# Patient Record
Sex: Female | Born: 1970
Health system: Southern US, Community
[De-identification: ages and names within clinical notes are randomized; demographics above are authoritative.]

## PROBLEM LIST (undated history)

## (undated) DIAGNOSIS — T8859XA Other complications of anesthesia, initial encounter: Secondary | ICD-10-CM

## (undated) DIAGNOSIS — K589 Irritable bowel syndrome without diarrhea: Secondary | ICD-10-CM

## (undated) DIAGNOSIS — T4145XA Adverse effect of unspecified anesthetic, initial encounter: Secondary | ICD-10-CM

## (undated) DIAGNOSIS — R112 Nausea with vomiting, unspecified: Secondary | ICD-10-CM

## (undated) DIAGNOSIS — L409 Psoriasis, unspecified: Secondary | ICD-10-CM

## (undated) DIAGNOSIS — D259 Leiomyoma of uterus, unspecified: Secondary | ICD-10-CM

## (undated) DIAGNOSIS — Z889 Allergy status to unspecified drugs, medicaments and biological substances status: Secondary | ICD-10-CM

## (undated) DIAGNOSIS — Z9889 Other specified postprocedural states: Secondary | ICD-10-CM

## (undated) DIAGNOSIS — L4050 Arthropathic psoriasis, unspecified: Secondary | ICD-10-CM

## (undated) DIAGNOSIS — K635 Polyp of colon: Secondary | ICD-10-CM

## (undated) DIAGNOSIS — N3010 Interstitial cystitis (chronic) without hematuria: Secondary | ICD-10-CM

## (undated) DIAGNOSIS — Z87410 Personal history of cervical dysplasia: Secondary | ICD-10-CM

## (undated) DIAGNOSIS — K5909 Other constipation: Secondary | ICD-10-CM

## (undated) HISTORY — PX: ESOPHAGOGASTRODUODENOSCOPY: SHX1529

## (undated) HISTORY — PX: FLEXIBLE SIGMOIDOSCOPY: SHX5431

## (undated) HISTORY — PX: OTHER SURGICAL HISTORY: SHX169

## (undated) HISTORY — PX: BLADDER SURGERY: SHX569

## (undated) HISTORY — PX: LIPOSUCTION TRUNK: SUR833

## (undated) HISTORY — PX: CHOLECYSTECTOMY: SHX55

## (undated) HISTORY — PX: COLONOSCOPY: SHX5424

## (undated) HISTORY — PX: TONSILLECTOMY: SUR1361

---
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
## 2000-07-03 ENCOUNTER — Ambulatory Visit (HOSPITAL_COMMUNITY): Admission: RE | Admit: 2000-07-03 | Discharge: 2000-07-03 | Payer: Self-pay | Admitting: *Deleted

## 2000-10-19 ENCOUNTER — Encounter: Payer: Self-pay | Admitting: Obstetrics and Gynecology

## 2000-10-19 ENCOUNTER — Ambulatory Visit (HOSPITAL_COMMUNITY): Admission: RE | Admit: 2000-10-19 | Discharge: 2000-10-19 | Payer: Self-pay | Admitting: Obstetrics and Gynecology

## 2000-11-04 ENCOUNTER — Other Ambulatory Visit: Admission: RE | Admit: 2000-11-04 | Discharge: 2000-11-04 | Payer: Self-pay | Admitting: Obstetrics and Gynecology

## 2001-03-14 ENCOUNTER — Inpatient Hospital Stay (HOSPITAL_COMMUNITY): Admission: AD | Admit: 2001-03-14 | Discharge: 2001-03-14 | Payer: Self-pay | Admitting: Obstetrics and Gynecology

## 2001-03-14 ENCOUNTER — Encounter: Payer: Self-pay | Admitting: Obstetrics and Gynecology

## 2001-07-16 ENCOUNTER — Inpatient Hospital Stay (HOSPITAL_COMMUNITY): Admission: AD | Admit: 2001-07-16 | Discharge: 2001-07-16 | Payer: Self-pay | Admitting: Obstetrics and Gynecology

## 2001-07-17 ENCOUNTER — Inpatient Hospital Stay (HOSPITAL_COMMUNITY): Admission: AD | Admit: 2001-07-17 | Discharge: 2001-07-17 | Payer: Self-pay | Admitting: *Deleted

## 2001-08-18 ENCOUNTER — Inpatient Hospital Stay (HOSPITAL_COMMUNITY): Admission: AD | Admit: 2001-08-18 | Discharge: 2001-08-18 | Payer: Self-pay | Admitting: Obstetrics and Gynecology

## 2001-09-01 ENCOUNTER — Inpatient Hospital Stay (HOSPITAL_COMMUNITY): Admission: AD | Admit: 2001-09-01 | Discharge: 2001-09-01 | Payer: Self-pay | Admitting: Obstetrics and Gynecology

## 2001-09-08 ENCOUNTER — Inpatient Hospital Stay (HOSPITAL_COMMUNITY): Admission: AD | Admit: 2001-09-08 | Discharge: 2001-09-11 | Payer: Self-pay | Admitting: Obstetrics and Gynecology

## 2001-10-18 ENCOUNTER — Other Ambulatory Visit: Admission: RE | Admit: 2001-10-18 | Discharge: 2001-10-18 | Payer: Self-pay | Admitting: Obstetrics and Gynecology

## 2002-05-27 ENCOUNTER — Ambulatory Visit (HOSPITAL_BASED_OUTPATIENT_CLINIC_OR_DEPARTMENT_OTHER): Admission: RE | Admit: 2002-05-27 | Discharge: 2002-05-27 | Payer: Self-pay | Admitting: *Deleted

## 2002-12-23 ENCOUNTER — Other Ambulatory Visit: Admission: RE | Admit: 2002-12-23 | Discharge: 2002-12-23 | Payer: Self-pay | Admitting: Obstetrics and Gynecology

## 2003-05-19 ENCOUNTER — Ambulatory Visit (HOSPITAL_BASED_OUTPATIENT_CLINIC_OR_DEPARTMENT_OTHER): Admission: RE | Admit: 2003-05-19 | Discharge: 2003-05-19 | Payer: Self-pay | Admitting: *Deleted

## 2003-07-31 ENCOUNTER — Other Ambulatory Visit: Admission: RE | Admit: 2003-07-31 | Discharge: 2003-07-31 | Payer: Self-pay | Admitting: Obstetrics and Gynecology

## 2004-01-22 ENCOUNTER — Other Ambulatory Visit: Admission: RE | Admit: 2004-01-22 | Discharge: 2004-01-22 | Payer: Self-pay | Admitting: Obstetrics and Gynecology

## 2004-07-05 ENCOUNTER — Ambulatory Visit (HOSPITAL_COMMUNITY): Admission: RE | Admit: 2004-07-05 | Discharge: 2004-07-05 | Payer: Self-pay | Admitting: *Deleted

## 2004-07-05 ENCOUNTER — Ambulatory Visit (HOSPITAL_BASED_OUTPATIENT_CLINIC_OR_DEPARTMENT_OTHER): Admission: RE | Admit: 2004-07-05 | Discharge: 2004-07-05 | Payer: Self-pay | Admitting: *Deleted

## 2006-01-13 ENCOUNTER — Ambulatory Visit: Payer: Self-pay | Admitting: Gastroenterology

## 2006-01-21 ENCOUNTER — Ambulatory Visit: Payer: Self-pay | Admitting: Otolaryngology

## 2006-07-16 ENCOUNTER — Ambulatory Visit: Payer: Self-pay | Admitting: Urology

## 2006-10-28 ENCOUNTER — Ambulatory Visit: Payer: Self-pay | Admitting: Urology

## 2007-07-15 ENCOUNTER — Ambulatory Visit: Payer: Self-pay | Admitting: General Practice

## 2008-01-05 ENCOUNTER — Ambulatory Visit: Payer: Self-pay | Admitting: General Practice

## 2010-01-29 ENCOUNTER — Ambulatory Visit: Payer: Self-pay | Admitting: General Practice

## 2012-07-06 ENCOUNTER — Other Ambulatory Visit (HOSPITAL_COMMUNITY): Payer: Self-pay | Admitting: Obstetrics and Gynecology

## 2012-07-06 DIAGNOSIS — Z1231 Encounter for screening mammogram for malignant neoplasm of breast: Secondary | ICD-10-CM

## 2012-07-23 ENCOUNTER — Ambulatory Visit (HOSPITAL_COMMUNITY)
Admission: RE | Admit: 2012-07-23 | Discharge: 2012-07-23 | Disposition: A | Discharge status: Home / Self Care | Payer: BC Managed Care – PPO | Source: Ambulatory Visit | Attending: Obstetrics and Gynecology | Admitting: Obstetrics and Gynecology

## 2012-07-23 DIAGNOSIS — Z1231 Encounter for screening mammogram for malignant neoplasm of breast: Secondary | ICD-10-CM | POA: Insufficient documentation

## 2013-05-30 DIAGNOSIS — N393 Stress incontinence (female) (male): Secondary | ICD-10-CM | POA: Insufficient documentation

## 2013-05-30 DIAGNOSIS — D3910 Neoplasm of uncertain behavior of unspecified ovary: Secondary | ICD-10-CM | POA: Insufficient documentation

## 2013-05-30 DIAGNOSIS — R3989 Other symptoms and signs involving the genitourinary system: Secondary | ICD-10-CM | POA: Insufficient documentation

## 2013-05-30 DIAGNOSIS — R350 Frequency of micturition: Secondary | ICD-10-CM | POA: Insufficient documentation

## 2013-05-30 DIAGNOSIS — N858 Other specified noninflammatory disorders of uterus: Secondary | ICD-10-CM | POA: Insufficient documentation

## 2013-05-30 DIAGNOSIS — N9489 Other specified conditions associated with female genital organs and menstrual cycle: Secondary | ICD-10-CM | POA: Insufficient documentation

## 2013-09-21 ENCOUNTER — Other Ambulatory Visit (HOSPITAL_COMMUNITY): Payer: Self-pay | Admitting: Obstetrics and Gynecology

## 2013-09-21 DIAGNOSIS — Z1231 Encounter for screening mammogram for malignant neoplasm of breast: Secondary | ICD-10-CM

## 2013-11-02 ENCOUNTER — Ambulatory Visit (HOSPITAL_COMMUNITY)
Admission: RE | Admit: 2013-11-02 | Discharge: 2013-11-02 | Disposition: A | Discharge status: Home / Self Care | Payer: BC Managed Care – PPO | Source: Ambulatory Visit | Attending: Obstetrics and Gynecology | Admitting: Obstetrics and Gynecology

## 2013-11-02 DIAGNOSIS — Z1231 Encounter for screening mammogram for malignant neoplasm of breast: Secondary | ICD-10-CM | POA: Insufficient documentation

## 2014-01-10 ENCOUNTER — Ambulatory Visit: Payer: Self-pay | Admitting: General Practice

## 2015-01-24 ENCOUNTER — Ambulatory Visit: Payer: Self-pay | Admitting: Surgery

## 2015-01-31 ENCOUNTER — Ambulatory Visit: Payer: Self-pay | Admitting: Surgery

## 2015-02-09 ENCOUNTER — Ambulatory Visit: Payer: Self-pay | Admitting: Surgery

## 2015-04-02 LAB — SURGICAL PATHOLOGY

## 2015-04-08 NOTE — Op Note (Signed)
PATIENT NAME:  Karina [name], Karina [name] MR#:  [id number] DATE OF BIRTH:  02-08-1971  DATE OF PROCEDURE:  02/09/2015  PREOPERATIVE DIAGNOSIS: Chronic cholecystitis.   POSTOPERATIVE DIAGNOSIS: Chronic cholecystitis.   PROCEDURE: Laparoscopic cholecystectomy, cholangiogram.   SURGEON: Rochel Brome, MD  ANESTHESIA: General.   INDICATIONS: This 44 year old female has a history of epigastric discomfort and back discomfort and fatty food intolerance and nausea and history of ultrasound findings of gallstones, recent ultrasound findings of gallbladder polyp, and surgery was recommended for definitive treatment.   DESCRIPTION OF PROCEDURE: The patient was placed on the operating table in the supine position under general endotracheal anesthesia. The abdomen was prepared with ChloraPrep and draped in a sterile manner.   A short incision was made in the inferior aspect of the umbilicus and carried down to the deep fascia which was grasped with a laryngeal hook and elevated. A Veress needle was inserted, aspirated, and irrigated with a saline solution. Next, the peritoneal cavity was inflated with carbon dioxide. The Veress needle was removed. The 10 mm cannula was inserted. The 10 mm, 0 degree laparoscope was inserted to view the peritoneal cavity. The liver appeared smooth. The gallbladder position was noted. There were a few adhesions in the right lower quadrant. The uterus was noted. There appeared to be a trace of menstrual blood.   Next, another incision was made in the epigastrium, slightly to the right of the midline, to introduce an 11 mm cannula. Two incisions were made in the lateral aspect of the right upper quadrant to introduce two 5 mm cannulas.   With the patient in the reverse Trendelenburg position and turned several degrees to the left side, the gallbladder was retracted towards the right shoulder. Multiple adhesions were taken down with blunt and sharp dissection. The infundibulum was retracted  inferiorly and laterally. The location of the porta hepatis was demonstrated. A number of additional adhesions were taken down with additional dissection. The gallbladder neck was mobilized with incision of the visceral peritoneum. The cystic duct was dissected free from surrounding structures. The cystic artery was dissected free from surrounding structures. There was a small lymph node adjacent to the cystic artery. A critical view of safety was demonstrated. An Endo Clip was placed across the cystic duct adjacent to the gallbladder neck. An incision was made in the cystic duct to introduce a Reddick catheter. Half-strength Conray-60 dye was injected as the cholangiogram was done with fluoroscopy viewing the biliary tree and flow of dye into the duodenum. There was a long cystic duct noted. The cholangiogram otherwise appeared normal with no retained stones. The Reddick catheter was removed. The cystic duct was doubly ligated with endoclips and divided. The cystic artery was controlled with endoclips and divided. Another branch of the cystic artery, which may be otherwise a duct of Luschka, was controlled with Endoclip and divided. The gallbladder was dissected free from the liver with hook and cautery. There was very minimal bleeding. It was noted during the near end of the dissection there was some leakage of bile which was aspirated. The gallbladder was subsequently delivered up through the infraumbilical incision and submitted for routine pathology. The operative site was further inspected and was irrigated with heparinized saline solution and aspirated. Hemostasis appeared to be intact. Next, the cannulas were removed, seeing no significant bleeding from the cannula sites. The skin incisions were closed with interrupted 5-0 chromic subcuticular suture, benzoin, and Steri-Strips. Dressings were applied with paper tape. The patient tolerated surgery satisfactorily and was  then prepared for transfer to the  recovery room.  ____________________________ Lenna Sciara. Rochel Brome, MD jws:sb D: 02/09/2015 09:17:25 ET T: 02/09/2015 11:11:51 ET JOB#: [id number]  cc: Loreli Dollar, MD, <Dictator> Loreli Dollar MD ELECTRONICALLY SIGNED 02/15/2015 13:35

## 2015-04-26 DIAGNOSIS — Z87898 Personal history of other specified conditions: Secondary | ICD-10-CM | POA: Insufficient documentation

## 2015-04-26 DIAGNOSIS — K219 Gastro-esophageal reflux disease without esophagitis: Secondary | ICD-10-CM | POA: Insufficient documentation

## 2015-04-26 DIAGNOSIS — L4050 Arthropathic psoriasis, unspecified: Secondary | ICD-10-CM | POA: Insufficient documentation

## 2015-04-26 DIAGNOSIS — Z8639 Personal history of other endocrine, nutritional and metabolic disease: Secondary | ICD-10-CM | POA: Insufficient documentation

## 2015-04-26 DIAGNOSIS — H269 Unspecified cataract: Secondary | ICD-10-CM | POA: Insufficient documentation

## 2015-12-26 DIAGNOSIS — Z79899 Other long term (current) drug therapy: Secondary | ICD-10-CM | POA: Insufficient documentation

## 2015-12-26 DIAGNOSIS — Z840 Family history of diseases of the skin and subcutaneous tissue: Secondary | ICD-10-CM | POA: Insufficient documentation

## 2017-03-21 ENCOUNTER — Encounter: Payer: Self-pay | Admitting: Emergency Medicine

## 2017-03-21 ENCOUNTER — Emergency Department
Admission: EM | Admit: 2017-03-21 | Discharge: 2017-03-21 | Disposition: A | Discharge status: Home / Self Care | Payer: BLUE CROSS/BLUE SHIELD | Attending: Emergency Medicine | Admitting: Emergency Medicine

## 2017-03-21 DIAGNOSIS — Y929 Unspecified place or not applicable: Secondary | ICD-10-CM | POA: Diagnosis not present

## 2017-03-21 DIAGNOSIS — Y939 Activity, unspecified: Secondary | ICD-10-CM | POA: Diagnosis not present

## 2017-03-21 DIAGNOSIS — S0121XA Laceration without foreign body of nose, initial encounter: Secondary | ICD-10-CM | POA: Diagnosis not present

## 2017-03-21 DIAGNOSIS — Y999 Unspecified external cause status: Secondary | ICD-10-CM | POA: Diagnosis not present

## 2017-03-21 DIAGNOSIS — S0185XA Open bite of other part of head, initial encounter: Secondary | ICD-10-CM | POA: Diagnosis present

## 2017-03-21 DIAGNOSIS — S0183XA Puncture wound without foreign body of other part of head, initial encounter: Secondary | ICD-10-CM | POA: Diagnosis not present

## 2017-03-21 DIAGNOSIS — W540XXA Bitten by dog, initial encounter: Secondary | ICD-10-CM | POA: Insufficient documentation

## 2017-03-21 HISTORY — DX: Arthropathic psoriasis, unspecified: L40.50

## 2017-03-21 HISTORY — DX: Interstitial cystitis (chronic) without hematuria: N30.10

## 2017-03-21 HISTORY — DX: Psoriasis, unspecified: L40.9

## 2017-03-21 MED ORDER — AMOXICILLIN-POT CLAVULANATE 875-125 MG PO TABS: 1.0000 | ORAL_TABLET | Freq: Two times a day (BID) | ORAL | 0 refills | Status: DC

## 2017-03-21 MED ORDER — CETIRIZINE HCL 10 MG PO TABS: 10.0000 mg | ORAL_TABLET | Freq: Every day | ORAL | 0 refills | Status: DC

## 2017-03-21 MED ORDER — OXYMETAZOLINE HCL 0.05 % NA SOLN: 2.0000 | Freq: Two times a day (BID) | NASAL | 2 refills | Status: DC | PRN

## 2017-03-21 MED ORDER — FLUTICASONE PROPIONATE 50 MCG/ACT NA SUSP: 1.0000 | Freq: Two times a day (BID) | NASAL | 0 refills | Status: DC

## 2017-03-21 NOTE — ED Triage Notes (Signed)
Pt was meeting friend's dog, sustained bite to face with multiple bite/puncture marks. Friend present with patient, states dog is up to date on rabies shots.

## 2017-03-21 NOTE — ED Notes (Signed)
Pt says her own dog nipped at her and she has a small laceration on her nose;

## 2017-03-21 NOTE — ED Provider Notes (Signed)
[facility] Emergency Department Provider Note  ____________________________________________  Time seen: Approximately 9:05 PM  I have reviewed the triage vital signs and the nursing notes.   HISTORY  Chief Complaint Animal Bite    HPI Karina [name] is a 46 y.o. female who presents emergency department complaining of dog bite to the face. Patient reports that she was meaning her cousin's dog for the first time. She leaned over into his dentist and yard and he nicked her face. The dog did make contact with the distal aspect of the left nares as well as the forehead. Bleeding was easily controlled to the face. She does report some mild oozing of blood from inside the nares. No frank epistaxis. No other injury. Patient's last tetanus shot was 15 years ago but she had a severe allergic reaction to same and has been advised never to receive tetanus injection again. No other medications prior to arrival.   Past Medical History:  Diagnosis Date  . Interstitial cystitis   . Psoriasis   . Psoriatic arthritis ([place])     There are no active problems to display for this patient.   Past Surgical History:  Procedure Laterality Date  . CHOLECYSTECTOMY      Prior to Admission medications   Medication Sig Start Date End Date Taking? Authorizing Provider  amoxicillin-clavulanate (AUGMENTIN) 875-125 MG tablet Take 1 tablet by mouth 2 (two) times daily. 03/21/17   Charline Bills Mathea Frieling, PA-C  cetirizine (ZYRTEC) 10 MG tablet Take 1 tablet (10 mg total) by mouth daily. 03/21/17   Charline Bills Tommie Bohlken, PA-C  fluticasone (FLONASE) 50 MCG/ACT nasal spray Place 1 spray into both nostrils 2 (two) times daily. 03/21/17   Charline Bills Ausha Sieh, PA-C  oxymetazoline (AFRIN) 0.05 % nasal spray Place 2 sprays into both nostrils 2 (two) times daily as needed (epistaxis). 03/21/17   Charline Bills Deantre Bourdon, PA-C    Allergies Macrobid [nitrofurantoin macrocrystal] and Tdap [diphth-acell  pertussis-tetanus]  No family history on file.  Social History Social History  Substance Use Topics  . Smoking status: Never Smoker  . Smokeless tobacco: Never Used  . Alcohol use No     Review of Systems  Constitutional: No fever/chills Eyes: No visual changes. No discharge ENT: Positive for injury to the nose. Positive for mild oozing blood from the left nares. Cardiovascular: no chest pain. Respiratory: no cough. No SOB. Gastrointestinal: No abdominal pain.  No nausea, no vomiting.  Musculoskeletal: Negative for musculoskeletal pain. Skin: Negative for rash, abrasions, lacerations, ecchymosis. Neurological: Negative for headaches, focal weakness or numbness. 10-point ROS otherwise negative.  ____________________________________________   PHYSICAL EXAM:  VITAL SIGNS: ED Triage Vitals  Enc Vitals Group     BP 03/21/17 2058 117/72     Pulse Rate 03/21/17 2058 86     Resp 03/21/17 2058 18     Temp 03/21/17 2058 97.9 F (36.6 C)     Temp Source 03/21/17 2058 Oral     SpO2 03/21/17 2058 99 %     Weight 03/21/17 2058 162 lb (73.5 kg)     Height 03/21/17 2058 5\' 4"  (1.626 m)     Head Circumference --      Peak Flow --      Pain Score 03/21/17 2057 5     Pain Loc --      Pain Edu? --      Excl. in [place]? --      Constitutional: Alert and oriented. Well appearing and in no acute  distress. Eyes: Conjunctivae are normal. PERRL. EOMI. Head: 2 small puncture wounds noted to the forehead, scrape/abrasion to the bridge of the nose, laceration to the left nares. Laceration measures approximately 0.75 cm in length. Edges are well approximated. I just do not move with palpation. No foreign body. Anterior laceration is mildly oozing blood. No frank bleeding. No other cranial injury identified. ENT:      Ears:       Nose: No congestion/rhinnorhea.      Mouth/Throat: Mucous membranes are moist.  Neck: No stridor.  No cervical spine tenderness to palpation.  Cardiovascular: Normal  rate, regular rhythm. Normal S1 and S2.  Good peripheral circulation. Respiratory: Normal respiratory effort without tachypnea or retractions. Lungs CTAB. Good air entry to the bases with no decreased or absent breath sounds. Musculoskeletal: Full range of motion to all extremities. No gross deformities appreciated. Neurologic:  Normal speech and language. No gross focal neurologic deficits are appreciated.  Skin:  Skin is warm, dry and intact. No rash noted. Psychiatric: Mood and affect are normal. Speech and behavior are normal. Patient exhibits appropriate insight and judgement.   ____________________________________________   LABS (all labs ordered are listed, but only abnormal results are displayed)  Labs Reviewed - No data to display ____________________________________________  EKG   ____________________________________________  RADIOLOGY   No results found.  ____________________________________________    PROCEDURES  Procedure(s) performed:    Procedures    Medications - No data to display   ____________________________________________   INITIAL IMPRESSION / ASSESSMENT AND PLAN / ED COURSE  Pertinent labs & imaging results that were available during my care of the patient were reviewed by me and considered in my medical decision making (see chart for details).  Review of the [place] CSRS was performed in accordance of the [place] prior to dispensing any controlled drugs.     Patient's diagnosis is consistent with dog bites to face. Patient has 4 small wounds to the face. Only one laceration with edges well approximated. At this time, with this injury occurring from a dog bite, no closure is attempted. Patient will be given allergy medications to prevent sneezing, Afrin should laceration continued to ooze blood, antibiotics prophylactically..  Patient is to follow up with primary care as needed or otherwise directed. Patient is given ED precautions to return to  the ED for any worsening or new symptoms.     ____________________________________________  FINAL CLINICAL IMPRESSION(S) / ED DIAGNOSES  Final diagnoses:  Dog bite of face, initial encounter      NEW MEDICATIONS STARTED DURING THIS VISIT:  New Prescriptions   AMOXICILLIN-CLAVULANATE (AUGMENTIN) 875-125 MG TABLET    Take 1 tablet by mouth 2 (two) times daily.   CETIRIZINE (ZYRTEC) 10 MG TABLET    Take 1 tablet (10 mg total) by mouth daily.   FLUTICASONE (FLONASE) 50 MCG/ACT NASAL SPRAY    Place 1 spray into both nostrils 2 (two) times daily.   OXYMETAZOLINE (AFRIN) 0.05 % NASAL SPRAY    Place 2 sprays into both nostrils 2 (two) times daily as needed (epistaxis).        This chart was dictated using voice recognition software/Dragon. Despite best efforts to proofread, errors can occur which can change the meaning. Any change was purely unintentional.    Darletta Moll, PA-C 03/21/17 2132    Orbie Pyo, MD 03/22/17 [id number]

## 2017-03-30 DIAGNOSIS — Z6828 Body mass index (BMI) 28.0-28.9, adult: Secondary | ICD-10-CM | POA: Diagnosis not present

## 2017-03-30 DIAGNOSIS — S0992XA Unspecified injury of nose, initial encounter: Secondary | ICD-10-CM | POA: Diagnosis not present

## 2017-06-17 DIAGNOSIS — H43813 Vitreous degeneration, bilateral: Secondary | ICD-10-CM | POA: Diagnosis not present

## 2017-07-01 DIAGNOSIS — G44219 Episodic tension-type headache, not intractable: Secondary | ICD-10-CM | POA: Diagnosis not present

## 2017-07-01 DIAGNOSIS — M530 Cervicocranial syndrome: Secondary | ICD-10-CM | POA: Diagnosis not present

## 2017-07-01 DIAGNOSIS — M5481 Occipital neuralgia: Secondary | ICD-10-CM | POA: Diagnosis not present

## 2017-07-08 DIAGNOSIS — M530 Cervicocranial syndrome: Secondary | ICD-10-CM | POA: Diagnosis not present

## 2017-07-08 DIAGNOSIS — G44219 Episodic tension-type headache, not intractable: Secondary | ICD-10-CM | POA: Diagnosis not present

## 2017-07-08 DIAGNOSIS — M5481 Occipital neuralgia: Secondary | ICD-10-CM | POA: Diagnosis not present

## 2017-07-13 DIAGNOSIS — M9901 Segmental and somatic dysfunction of cervical region: Secondary | ICD-10-CM | POA: Diagnosis not present

## 2017-07-13 DIAGNOSIS — M530 Cervicocranial syndrome: Secondary | ICD-10-CM | POA: Diagnosis not present

## 2017-07-13 DIAGNOSIS — G44219 Episodic tension-type headache, not intractable: Secondary | ICD-10-CM | POA: Diagnosis not present

## 2017-07-13 DIAGNOSIS — M5481 Occipital neuralgia: Secondary | ICD-10-CM | POA: Diagnosis not present

## 2017-07-20 DIAGNOSIS — M530 Cervicocranial syndrome: Secondary | ICD-10-CM | POA: Diagnosis not present

## 2017-07-20 DIAGNOSIS — G44219 Episodic tension-type headache, not intractable: Secondary | ICD-10-CM | POA: Diagnosis not present

## 2017-07-20 DIAGNOSIS — M5481 Occipital neuralgia: Secondary | ICD-10-CM | POA: Diagnosis not present

## 2017-07-20 DIAGNOSIS — M9901 Segmental and somatic dysfunction of cervical region: Secondary | ICD-10-CM | POA: Diagnosis not present

## 2017-07-30 DIAGNOSIS — R87610 Atypical squamous cells of undetermined significance on cytologic smear of cervix (ASC-US): Secondary | ICD-10-CM | POA: Diagnosis not present

## 2017-07-30 DIAGNOSIS — Z1151 Encounter for screening for human papillomavirus (HPV): Secondary | ICD-10-CM | POA: Diagnosis not present

## 2017-07-30 DIAGNOSIS — R109 Unspecified abdominal pain: Secondary | ICD-10-CM | POA: Diagnosis not present

## 2017-07-30 DIAGNOSIS — R5383 Other fatigue: Secondary | ICD-10-CM | POA: Diagnosis not present

## 2017-07-30 DIAGNOSIS — Z1231 Encounter for screening mammogram for malignant neoplasm of breast: Secondary | ICD-10-CM | POA: Diagnosis not present

## 2017-07-30 DIAGNOSIS — Z6827 Body mass index (BMI) 27.0-27.9, adult: Secondary | ICD-10-CM | POA: Diagnosis not present

## 2017-07-30 DIAGNOSIS — Z01419 Encounter for gynecological examination (general) (routine) without abnormal findings: Secondary | ICD-10-CM | POA: Diagnosis not present

## 2017-09-22 DIAGNOSIS — R339 Retention of urine, unspecified: Secondary | ICD-10-CM | POA: Diagnosis not present

## 2017-09-22 DIAGNOSIS — N3010 Interstitial cystitis (chronic) without hematuria: Secondary | ICD-10-CM | POA: Diagnosis not present

## 2017-09-22 DIAGNOSIS — R350 Frequency of micturition: Secondary | ICD-10-CM | POA: Diagnosis not present

## 2017-09-22 DIAGNOSIS — N393 Stress incontinence (female) (male): Secondary | ICD-10-CM | POA: Diagnosis not present

## 2017-09-23 ENCOUNTER — Encounter: Payer: Self-pay | Admitting: Neurology

## 2017-09-23 ENCOUNTER — Ambulatory Visit (INDEPENDENT_AMBULATORY_CARE_PROVIDER_SITE_OTHER): Payer: BLUE CROSS/BLUE SHIELD | Admitting: Neurology

## 2017-09-23 VITALS — BP 108/69 | HR 83 | Ht 64.0 in | Wt 164.4 lb

## 2017-09-23 DIAGNOSIS — H9209 Otalgia, unspecified ear: Secondary | ICD-10-CM | POA: Diagnosis not present

## 2017-09-23 DIAGNOSIS — G8929 Other chronic pain: Secondary | ICD-10-CM

## 2017-09-23 DIAGNOSIS — M4802 Spinal stenosis, cervical region: Secondary | ICD-10-CM

## 2017-09-23 DIAGNOSIS — M4712 Other spondylosis with myelopathy, cervical region: Secondary | ICD-10-CM

## 2017-09-23 DIAGNOSIS — H905 Unspecified sensorineural hearing loss: Secondary | ICD-10-CM | POA: Diagnosis not present

## 2017-09-23 DIAGNOSIS — R51 Headache with orthostatic component, not elsewhere classified: Secondary | ICD-10-CM

## 2017-09-23 DIAGNOSIS — H9190 Unspecified hearing loss, unspecified ear: Secondary | ICD-10-CM

## 2017-09-23 DIAGNOSIS — R519 Headache, unspecified: Secondary | ICD-10-CM | POA: Insufficient documentation

## 2017-09-23 DIAGNOSIS — R42 Dizziness and giddiness: Secondary | ICD-10-CM

## 2017-09-23 DIAGNOSIS — H543 Unqualified visual loss, both eyes: Secondary | ICD-10-CM | POA: Diagnosis not present

## 2017-09-23 DIAGNOSIS — G992 Myelopathy in diseases classified elsewhere: Secondary | ICD-10-CM

## 2017-09-23 MED ORDER — TIZANIDINE HCL 4 MG PO TABS: 4.0000 mg | ORAL_TABLET | Freq: Four times a day (QID) | ORAL | 6 refills | Status: DC | PRN

## 2017-09-23 NOTE — Patient Instructions (Addendum)
MRI of the brain and cervical spine Tizanidine up to 3x a day however if this is migraine may need to have you back for migraine specific medication management and discussion  Occipital Nerve Block Patient Information  Description: The occipital nerves originate in the cervical (neck) spinal cord and travel upward through muscle and tissue to supply sensation to the back of the head and top of the scalp.  In addition, the nerves control some of the muscles of the scalp.  Occipital neuralgia is an irritation of these nerves which can cause headaches, numbness of the scalp, and neck discomfort.     The occipital nerve block will interrupt nerve transmission through these nerves and can relieve pain and spasm.  The block consists of insertion of a small needle under the skin in the back of the head to deposit local anesthetic (numbing medicine) and/or steroids around the nerve.  The entire block usually lasts less than 5 minutes.  Conditions which may be treated by occipital blocks:   Muscular pain and spasm of the scalp  Nerve irritation, back of the head  Headaches  Upper neck pain  Preparation for the injection:  1. Do not eat any solid food or dairy products within 8 hours of your appointment. 2. You may drink clear liquids up to 3 hours before appointment.  Clear liquids include water, black coffee, juice or soda.  No milk or cream please. 3. You may take your regular medication, including pain medications, with a sip of water before you appointment.  Diabetics should hold regular insulin (if taken separately) and take 1/2 normal NPH dose the morning of the procedure.  Carry some sugar containing items with you to your appointment. 4. A driver must accompany you and be prepared to drive you home after your procedure. 5. Bring all your current medications with you. 6. An IV may be inserted and sedation may be given at the discretion of the physician. 7. A blood pressure cuff, EKG, and  other monitors will often be applied during the procedure.  Some patients may need to have extra oxygen administered for a short period. 8. You will be asked to provide medical information, including your allergies and medications, prior to the procedure.  We must know immediately if you are taking blood thinners (like Coumadin/Warfarin) or if you are allergic to IV iodine contrast (dye).  We must know if you could possible be pregnant.  9. Do not wear a high collared shirt or turtleneck.  Tie long hair up in the back if possible.  Possible side-effects:   Bleeding from needle site  Infection (rare, may require surgery)  Nerve injury (rare)  Hair on back of neck can be tinged with iodine scrub (this will wash out)  Light-headedness (temporary)  Pain at injection site (several days)  Decreased blood pressure (rare, temporary)  Seizure (very rare)  Call if you experience:   Hives or difficulty breathing ( go to the emergency room)  Inflammation or drainage at the injection site(s)  Please note:  Although the local anesthetic injected can often make your painful muscles or headache feel good for several hours after the injection, the pain may return.  It takes 3-7 days for steroids to work.  You may not notice any pain relief for at least one week.  If effective, we will often do a series of injections spaced 3-6 weeks apart to maximally decrease your pain.  Tizanidine tablets or capsules What is this medicine? TIZANIDINE (tye  ZAN i deen) helps to relieve muscle spasms. It may be used to help in the treatment of multiple sclerosis and spinal cord injury. This medicine may be used for other purposes; ask your health care provider or pharmacist if you have questions. COMMON BRAND NAME(S): Zanaflex What should I tell my health care provider before I take this medicine? They need to know if you have any of these conditions: -kidney disease -liver disease -low blood  pressure -mental disorder -an unusual or allergic reaction to tizanidine, other medicines, lactose (tablets only), foods, dyes, or preservatives -pregnant or trying to get pregnant -breast-feeding How should I use this medicine? Take this medicine by mouth with a full glass of water. Take this medicine on an empty stomach, at least 30 minutes before or 2 hours after food. Do not take with food unless you talk with your doctor. Follow the directions on the prescription label. Take your medicine at regular intervals. Do not take your medicine more often than directed. Do not stop taking except on your doctor's advice. Suddenly stopping the medicine can be very dangerous. Talk to your pediatrician regarding the use of this medicine in children. Patients over 88 years old may have a stronger reaction and need a smaller dose. Overdosage: If you think you have taken too much of this medicine contact a poison control center or emergency room at once. NOTE: This medicine is only for you. Do not share this medicine with others. What if I miss a dose? If you miss a dose, take it as soon as you can. If it is almost time for your next dose, take only that dose. Do not take double or extra doses. What may interact with this medicine? Do not take this medicine with any of the following medications: -ciprofloxacin -cisapride -dofetilide -dronedarone -fluvoxamine -narcotic medicines for cough -pimozide -thiabendazole -thioridazine -ziprasidone This medicine may also interact with the following medications: -acyclovir -alcohol -antihistamines for allergy, cough and cold -baclofen -certain antibiotics like levofloxacin, ofloxacin -certain medicines for anxiety or sleep -certain medicines for blood pressure, heart disease, irregular heart beat -certain medicines for depression like amitriptyline, fluoxetine, sertraline -certain medicines for seizures like phenobarbital, primidone -certain medicines  for stomach problems like cimetidine, famotidine -female hormones, like estrogens or progestins and birth control pills, patches, rings, or injections -general anesthetics like halothane, isoflurane, methoxyflurane, propofol -local anesthetics like lidocaine, pramoxine, tetracaine -medicines that relax muscles for surgery -narcotic medicines for pain -other medicines that prolong the QT interval (cause an abnormal heart rhythm) -phenothiazines like chlorpromazine, mesoridazine, prochlorperazine -ticlopidine -zileuton This list may not describe all possible interactions. Give your health care provider a list of all the medicines, herbs, non-prescription drugs, or dietary supplements you use. Also tell them if you smoke, drink alcohol, or use illegal drugs. Some items may interact with your medicine. What should I watch for while using this medicine? Tell your doctor or health care professional if your symptoms do not start to get better or if they get worse. You may get drowsy or dizzy. Do not drive, use machinery, or do anything that needs mental alertness until you know how this medicine affects you. Do not stand or sit up quickly, especially if you are an older patient. This reduces the risk of dizzy or fainting spells. Alcohol may interfere with the effect of this medicine. Avoid alcoholic drinks. If you are taking another medicine that also causes drowsiness, you may have more side effects. Give your health care provider a list of all  medicines you use. Your doctor will tell you how much medicine to take. Do not take more medicine than directed. Call emergency for help if you have problems breathing or unusual sleepiness. Your mouth may get dry. Chewing sugarless gum or sucking hard candy, and drinking plenty of water may help. Contact your doctor if the problem does not go away or is severe. What side effects may I notice from receiving this medicine? Side effects that you should report to your  doctor or health care professional as soon as possible: -allergic reactions like skin rash, itching or hives, swelling of the face, lips, or tongue -breathing problems -hallucinations -signs and symptoms of liver injury like dark yellow or brown urine; general ill feeling or flu-like symptoms; light-colored stools; loss of appetite; nausea; right upper quadrant belly pain; unusually weak or tired; yellowing of the eyes or skin -signs and symptoms of low blood pressure like dizziness; feeling faint or lightheaded, falls; unusually weak or tired -unusually slow heartbeat -unusually weak or tired Side effects that usually do not require medical attention (report to your doctor or health care professional if they continue or are bothersome): -blurred vision -constipation -dizziness -dry mouth -tiredness This list may not describe all possible side effects. Call your doctor for medical advice about side effects. You may report side effects to FDA at 1-800-FDA-1088. Where should I keep my medicine? Keep out of the reach of children. Store at room temperature between 15 and 30 degrees C (59 and 86 degrees F). Throw away any unused medicine after the expiration date. NOTE: This sheet is a summary. It may not cover all possible information. If you have questions about this medicine, talk to your doctor, pharmacist, or health care provider.  2018 [employer]vier/Gold Standard (2015-09-04 13:52:12)

## 2017-09-23 NOTE — Addendum Note (Signed)
Addended by: Sarina Ill B on: 09/23/2017 02:40 PM   Modules accepted: Orders

## 2017-09-23 NOTE — Progress Notes (Addendum)
[facility]    Provider:  Dr Jaynee Eagles Referring Provider: Servando Salina, MD Primary Care Physician:  Servando Salina, MD  CC:  Occipital headache  HPI:  Karina [name] is a 46 y.o. female here as a referral from Dr. Garwin Brothers for headache. PMHx cystitis. Starts around the neck (points to about c4) and it curls up over the back of the head. Started 4-5 months ago. It is progressively worsening. No hx of significant headaches or migraines. She has pain in her ears and shooting pains to right above her eyes. She had her eyes checked in July, her vision is changing though she feels. More pulsating in the back. Like someone is squeezing, some light sensitivity, She has dizziness and she will lose vision completely black and then come back. Numbness and tingling in the fingers and toes. She has weakness in the legs, no loss of consciousness. The headaches are daily and continuous, She has 5-6 pain (he has a high pain tolerance she reports). No inciting events or trauma, she did have her tooth extracted but no localized pain there. Worse as the day goes on. Moving exacerbates it. Grandmother with strokes and hemorrhage. Symmetric. She has nausea. No vomiting. She feels hot then cold, she feels like she can;t think straight, no seizure-like episodes. She has tried Restaurant manager, fast food, massage.   Reviewed notes, labs and imaging from outside physicians, which showed:   CT soft tissue of neck:   IMPRESSION:   1.     I do not see evidence of an abnormal mass within the neck. The  submandibular glands appear symmetric in size and enhance in a normal  fashion.  2.     I do not see pathologic sized cervical lymph nodes present.  3.     Should the patient have persistent clinical findings or evidence of  an enlarging mass, follow-up scanning would be of value.   Review of Systems: Patient complains of symptoms per HPI as well as the following symptoms: no CP, no SOB. Pertinent  negatives and positives per HPI. All others negative.   Social History   Social History  . Marital status: Married    Spouse name: N/A  . Number of children: N/A  . Years of education: N/A   Occupational History  . Not on file.   Social History Main Topics  . Smoking status: Never Smoker  . Smokeless tobacco: Never Used  . Alcohol use No  . Drug use: No  . Sexual activity: Not on file   Other Topics Concern  . Not on file   Social History Narrative  . No narrative on file    Family History  Problem Relation Age of Onset  . Migraines Neg Hx     Past Medical History:  Diagnosis Date  . Interstitial cystitis   . Psoriasis   . Psoriatic arthritis [facility])     Past Surgical History:  Procedure Laterality Date  . bladder hydrodistentions    . CHOLECYSTECTOMY      Current Outpatient Prescriptions  Medication Sig Dispense Refill  . Pediatric Multivit-Minerals-C (CHEWABLES MULTIVITAMIN PO) Take by mouth.     No current facility-administered medications for this visit.     Allergies as of 09/23/2017 - Review Complete 09/23/2017  Allergen Reaction Noted  . Macrobid [nitrofurantoin macrocrystal] Nausea And Vomiting and Rash 03/21/2017  . Tdap [diphth-acell pertussis-tetanus] Swelling and Rash 03/21/2017    Vitals: BP 108/69 (BP Location: Right Arm, Patient Position: Sitting, Cuff Size: Small)  Pulse 83   Ht 5\' 4"  (1.626 m)   Wt 164 lb 6.4 oz (74.6 kg)   BMI 28.22 kg/m  Last Weight:  Wt Readings from Last 1 Encounters:  09/23/17 164 lb 6.4 oz (74.6 kg)   Last Height:   Ht Readings from Last 1 Encounters:  09/23/17 5\' 4"  (1.626 m)    Physical exam: Exam: Gen: NAD, conversant, well nourised, well groomed                     CV: RRR, no MRG. No Carotid Bruits. No peripheral edema, warm, nontender Eyes: Conjunctivae clear without exudates or hemorrhage  Neuro: Detailed Neurologic Exam  Speech:    Speech is normal; fluent and spontaneous with normal  comprehension.  Cognition:    The patient is oriented to person, place, and time;     recent and remote memory intact;     language fluent;     normal attention, concentration,     fund of knowledge Cranial Nerves:    The pupils are equal, round, and reactive to light. The fundi are normal and spontaneous venous pulsations are present. Visual fields are full to finger confrontation. Extraocular movements are intact. Trigeminal sensation is intact and the muscles of mastication are normal. The face is symmetric. The palate elevates in the midline. Hearing intact. Voice is normal. Shoulder shrug is normal. The tongue has normal motion without fasciculations.   Coordination:    Normal finger to nose and heel to shin. Normal rapid alternating movements.   Gait:    Heel-toe and tandem gait are normal.   Motor Observation:    No asymmetry, no atrophy, and no involuntary movements noted. Tone:    Normal muscle tone.    Posture:    Posture is normal. normal erect    Strength:    Strength is V/V in the upper and lower limbs.      Sensation: intact to LT     Reflex Exam:  DTR's:    Deep tendon reflexes in the upper and lower extremities are normal bilaterally.   Toes:    The toes are downgoing bilaterally.   Clonus:    Clonus is absent.      Assessment/Plan:  46 year old female with occipital headache and neck pain with movement. Neuro exam in non focal. Pain is bilateral and is located in the distribution of the greater, lesser and/or third occipital nerves and radiates to the frontal scalp area and eyes with episodic complete loss of vision and hearing changes and ear pain, with tenderness and trigger points at the emergence of the greater occipital nerve. Differential includes pain in the upper cervical joints or disks, suboccipital or upper posterior neck muscles including the traps/scm, spinal and posterior cranial fossa dura mater, vertebral arteries, structural and infiltrative  lesions such as meningioma, schwannoma, myelitis, compressive disk disease and others. Will need MRI of the brain and cervical spine. Will restart Tizanidine   - Can consider occipital nerve blocks after imaging results.  -Tizanidine up to 3x a day however if this is migraine may need to have you back for migraine specific medication management and discussion - MRI of the brain with and without contrast due to positional headache and ear pain and hearing changes along with complete vision loss bilaterallyto to evaluate for occupying mass, schwannomas, or other lesions that can cause this. - MRI of the cervical spine to evaluate for cervical radiculopathy and surgical options, C2-C3 nerve root  impingement that can cause occipital headaches and syndromes, transverse myelitis and given her sensory symptoms. - patient is fatigued, CMP, TSH and CBC were ordered will request results.  Discussed: To prevent or relieve headaches, try the following: Cool Compress. Lie down and place a cool compress on your head.  Avoid headache triggers. If certain foods or odors seem to have triggered your migraines in the past, avoid them. A headache diary might help you identify triggers.  Include physical activity in your daily routine. Try a daily walk or other moderate aerobic exercise.  Manage stress. Find healthy ways to cope with the stressors, such as delegating tasks on your to-do list.  Practice relaxation techniques. Try deep breathing, yoga, massage and visualization.  Eat regularly. Eating regularly scheduled meals and maintaining a healthy diet might help prevent headaches. Also, drink plenty of fluids.  Follow a regular sleep schedule. Sleep deprivation might contribute to headaches Consider biofeedback. With this mind-body technique, you learn to control certain bodily functions - such as muscle tension, heart rate and blood pressure - to prevent headaches or reduce headache pain.    Proceed to emergency  room if you experience new or worsening symptoms or symptoms do not resolve, if you have new neurologic symptoms or if headache is severe, or for any concerning symptom.   Provided education and documentation from American headache Society toolbox including articles on: chronic migraine medication overuse headache, chronic migraines, prevention of migraines, behavioral and other nonpharmacologic treatments for headache.  Orders Placed This Encounter  Procedures  . MR BRAIN W WO CONTRAST  . MR CERVICAL SPINE WO CONTRAST    Performed by Dr. Jaynee Eagles M.D. 28ml Lidocaine 1%,30ml Marcaine 0.5% in the 30-gauge needle was used. All procedures a documented blood were medically necessary, reasonable and appropriate based on the patient's history, medical diagnosis and physician opinion. Verbal informed consent was obtained from the patient, patient was informed of potential risk of procedure, including bruising, bleeding, hematoma formation, infection, muscle weakness, muscle pain, numbness, transient hypertension, transient hyperglycemia and transient insomnia among others. All areas injected were topically clean with isopropyl rubbing alcohol. Nonsterile nonlatex gloves were worn during the procedure.  1. Greater occipital nerve block [id number]). The greater occipital nerve site was identified at the nuchal line medial to the occipital artery. Medication was injected into the left and right occipital nerve areas and suboccipital areas. Patient's condition is associated with inflammation of the greater occipital nerve and associated multiple groups. Injection was deemed medically necessary, reasonable and appropriate. Injection represents a separate and unique surgical service.  2. Lesser occipital nerve block [id number]). The lesser occipital nerve site was identified approximately 2 cm lateral to the greater occipital nerve. Occasion was injected into the left and right occipital nerve areas. Patient's condition is  associated with inflammation of the lesser occipital nerve and associated muscle groups. Injection was deemed medically necessary, reasonable and appropriate. Injection represents a separate and unique surgical service.  3. Auriculotemporal nerve block (09811): The Auriculotemporal nerve site was identified along the posterior margin of the sternocleidomastoid muscle toward the base of the ear. Medication was injected into the left and right radicular temporal nerve areas. Patient's condition is associated with inflammation of the Auriculotemporal Nerve and associated muscle groups. Injection was deemed medically necessary, reasonable and appropriate. Injection represents a separate and unique surgical service.   Cc: Servando Salina, MD  Sarina Ill, MD  [facility] [address] [place], [place] [address]  Phone [phone] Fax [phone]

## 2017-10-07 ENCOUNTER — Ambulatory Visit
Admission: RE | Admit: 2017-10-07 | Discharge: 2017-10-07 | Disposition: A | Discharge status: Home / Self Care | Payer: BLUE CROSS/BLUE SHIELD | Source: Ambulatory Visit | Attending: Neurology | Admitting: Neurology

## 2017-10-07 ENCOUNTER — Encounter: Payer: Self-pay | Admitting: Neurology

## 2017-10-07 DIAGNOSIS — R51 Headache with orthostatic component, not elsewhere classified: Secondary | ICD-10-CM

## 2017-10-07 DIAGNOSIS — M4802 Spinal stenosis, cervical region: Secondary | ICD-10-CM

## 2017-10-07 DIAGNOSIS — R519 Headache, unspecified: Secondary | ICD-10-CM

## 2017-10-07 DIAGNOSIS — H9209 Otalgia, unspecified ear: Secondary | ICD-10-CM

## 2017-10-07 DIAGNOSIS — R42 Dizziness and giddiness: Secondary | ICD-10-CM

## 2017-10-07 DIAGNOSIS — G8929 Other chronic pain: Secondary | ICD-10-CM

## 2017-10-07 DIAGNOSIS — M4712 Other spondylosis with myelopathy, cervical region: Secondary | ICD-10-CM | POA: Diagnosis not present

## 2017-10-07 DIAGNOSIS — G992 Myelopathy in diseases classified elsewhere: Secondary | ICD-10-CM

## 2017-10-07 DIAGNOSIS — H543 Unqualified visual loss, both eyes: Secondary | ICD-10-CM

## 2017-10-07 DIAGNOSIS — H9190 Unspecified hearing loss, unspecified ear: Secondary | ICD-10-CM

## 2017-10-07 MED ORDER — GADOBENATE DIMEGLUMINE 529 MG/ML IV SOLN
16.0000 mL | Freq: Once | INTRAVENOUS | Status: AC | PRN
  Administered 2017-10-07: 16 mL via INTRAVENOUS

## 2017-10-19 ENCOUNTER — Telehealth: Payer: Self-pay | Admitting: *Deleted

## 2017-10-19 NOTE — Telephone Encounter (Signed)
Attempted to call patient and LVM asking for call back.  Call is to discuss how she is feeling; may consider an appt for a nerve block with Dr. Jaynee Eagles next week.

## 2017-11-03 DIAGNOSIS — M79671 Pain in right foot: Secondary | ICD-10-CM | POA: Diagnosis not present

## 2017-11-03 DIAGNOSIS — B070 Plantar wart: Secondary | ICD-10-CM | POA: Diagnosis not present

## 2017-11-03 DIAGNOSIS — M79672 Pain in left foot: Secondary | ICD-10-CM | POA: Diagnosis not present

## 2017-11-16 ENCOUNTER — Telehealth: Payer: Self-pay | Admitting: *Deleted

## 2017-11-16 NOTE — Telephone Encounter (Signed)
Called and LVM (ok per DPR) on pt's cell phone informing patient that the office will be closed tomorrow 12/11 due to the snow. We will call back ASAP to reschedule the patient.   Also attempted pt's home # and husband (on [place]) but there was no answer.

## 2017-11-17 ENCOUNTER — Ambulatory Visit: Payer: BLUE CROSS/BLUE SHIELD | Admitting: Neurology

## 2017-11-18 DIAGNOSIS — B070 Plantar wart: Secondary | ICD-10-CM | POA: Diagnosis not present

## 2017-11-18 DIAGNOSIS — M79671 Pain in right foot: Secondary | ICD-10-CM | POA: Diagnosis not present

## 2017-11-18 DIAGNOSIS — M79672 Pain in left foot: Secondary | ICD-10-CM | POA: Diagnosis not present

## 2017-11-19 NOTE — Telephone Encounter (Signed)
Called and spoke with pt. Rescheduled her for Tuesday 11/24/17 @ 4:30 pm with arrival time of 4:00. She verbalized understanding and appreciation.

## 2017-11-24 ENCOUNTER — Ambulatory Visit: Payer: Self-pay | Admitting: Neurology

## 2017-12-08 DIAGNOSIS — J0100 Acute maxillary sinusitis, unspecified: Secondary | ICD-10-CM | POA: Diagnosis not present

## 2017-12-09 DIAGNOSIS — M79671 Pain in right foot: Secondary | ICD-10-CM | POA: Diagnosis not present

## 2017-12-09 DIAGNOSIS — M79672 Pain in left foot: Secondary | ICD-10-CM | POA: Diagnosis not present

## 2017-12-09 DIAGNOSIS — B070 Plantar wart: Secondary | ICD-10-CM | POA: Diagnosis not present

## 2017-12-18 DIAGNOSIS — J069 Acute upper respiratory infection, unspecified: Secondary | ICD-10-CM | POA: Diagnosis not present

## 2017-12-18 DIAGNOSIS — J019 Acute sinusitis, unspecified: Secondary | ICD-10-CM | POA: Diagnosis not present

## 2017-12-28 DIAGNOSIS — B9689 Other specified bacterial agents as the cause of diseases classified elsewhere: Secondary | ICD-10-CM | POA: Diagnosis not present

## 2017-12-28 DIAGNOSIS — J019 Acute sinusitis, unspecified: Secondary | ICD-10-CM | POA: Diagnosis not present

## 2018-03-08 DIAGNOSIS — J320 Chronic maxillary sinusitis: Secondary | ICD-10-CM | POA: Diagnosis not present

## 2018-04-05 DIAGNOSIS — J340 Abscess, furuncle and carbuncle of nose: Secondary | ICD-10-CM | POA: Diagnosis not present

## 2018-04-05 DIAGNOSIS — J301 Allergic rhinitis due to pollen: Secondary | ICD-10-CM | POA: Diagnosis not present

## 2018-04-05 DIAGNOSIS — J320 Chronic maxillary sinusitis: Secondary | ICD-10-CM | POA: Diagnosis not present

## 2018-05-17 ENCOUNTER — Telehealth: Payer: Self-pay | Admitting: Neurology

## 2018-05-17 ENCOUNTER — Encounter: Payer: Self-pay | Admitting: Neurology

## 2018-05-17 NOTE — Telephone Encounter (Signed)
Can see pt on Friday for injection.

## 2018-05-17 NOTE — Telephone Encounter (Signed)
-----   Message from [place], Generic sent at 05/17/2018 12:48 PM EDT -----    Appointment Request From: Karina [name]    With Provider: Melvenia Beam, MD [[facility]]    Preferred Date Range: 05/20/2018 - 05/24/2018    Preferred Times: Monday Afternoon, Thursday Morning, Thursday Afternoon, Friday Afternoon    Reason for visit: Request an Appointment    Comments:  Shots for headaches

## 2018-05-18 NOTE — Telephone Encounter (Signed)
Called pt & LVM (ok per DPR) asking for call back. Informed her that we can do an injection for her this Friday 6/14 at 12:00. When pt calls back, please confirm she is ok with this and we can get her scheduled.

## 2018-05-19 NOTE — Telephone Encounter (Signed)
Called patient once more and LVM asking for call back to get her scheduled for injection. Left office number in message.

## 2018-08-02 ENCOUNTER — Encounter: Payer: Self-pay | Admitting: Urology

## 2018-08-02 ENCOUNTER — Ambulatory Visit: Payer: BLUE CROSS/BLUE SHIELD | Admitting: Urology

## 2018-08-02 VITALS — BP 97/66 | HR 106 | Ht 65.0 in | Wt 174.4 lb

## 2018-08-02 DIAGNOSIS — Z01419 Encounter for gynecological examination (general) (routine) without abnormal findings: Secondary | ICD-10-CM | POA: Diagnosis not present

## 2018-08-02 DIAGNOSIS — R300 Dysuria: Secondary | ICD-10-CM | POA: Diagnosis not present

## 2018-08-02 DIAGNOSIS — R339 Retention of urine, unspecified: Secondary | ICD-10-CM | POA: Diagnosis not present

## 2018-08-02 DIAGNOSIS — N3010 Interstitial cystitis (chronic) without hematuria: Secondary | ICD-10-CM | POA: Diagnosis not present

## 2018-08-02 DIAGNOSIS — Z6828 Body mass index (BMI) 28.0-28.9, adult: Secondary | ICD-10-CM | POA: Diagnosis not present

## 2018-08-02 DIAGNOSIS — Z1231 Encounter for screening mammogram for malignant neoplasm of breast: Secondary | ICD-10-CM | POA: Diagnosis not present

## 2018-08-02 DIAGNOSIS — Z1151 Encounter for screening for human papillomavirus (HPV): Secondary | ICD-10-CM | POA: Diagnosis not present

## 2018-08-02 DIAGNOSIS — R87610 Atypical squamous cells of undetermined significance on cytologic smear of cervix (ASC-US): Secondary | ICD-10-CM | POA: Diagnosis not present

## 2018-08-02 LAB — URINALYSIS, COMPLETE
BILIRUBIN UA: NEGATIVE
GLUCOSE, UA: NEGATIVE
KETONES UA: NEGATIVE
Leukocytes, UA: NEGATIVE
Nitrite, UA: NEGATIVE
PH UA: 5.5 (ref 5.0–7.5)
PROTEIN UA: NEGATIVE
RBC UA: NEGATIVE
SPEC GRAV UA: 1.02 (ref 1.005–1.030)
UUROB: 0.2 mg/dL (ref 0.2–1.0)

## 2018-08-02 LAB — BLADDER SCAN AMB NON-IMAGING

## 2018-08-02 LAB — MICROSCOPIC EXAMINATION
RBC, UA: NONE SEEN /hpf (ref 0–2)
WBC, UA: NONE SEEN /hpf (ref 0–5)

## 2018-08-02 NOTE — Progress Notes (Signed)
08/02/2018 3:34 PM   Karina [name] 03/03/1971 [id number]  Referring provider: No referring provider defined for this encounter.  Chief Complaint  Patient presents with  . Establish Care    HPI: 47 year old female presents to establish local urologic care.  She has previously seen Dr. Jacqlyn Larsen for interstitial cystitis, stress incontinence and a history of recurrent urinary tract infections.  She last saw him in October 2018.  She was on demand therapy for recurrent UTIs with cephalexin and would also take Diflucan for vaginal yeast infections.  She had been on oxybutynin for storage related voiding symptoms however is no longer taking this medication.  She took Uribel for bladder spasms however it became cost prohibitive as it was not covered by her insurance.  Since her last visit she has noted some increased stress urinary incontinence.  She has baseline urinary frequency, urgency and a burning spasmodic pain which has been helped previously by Uribel.  Denies gross hematuria.   PMH: Past Medical History:  Diagnosis Date  . Interstitial cystitis   . Psoriasis   . Psoriatic arthritis [facility])     Surgical History: Past Surgical History:  Procedure Laterality Date  . bladder hydrodistentions    . CHOLECYSTECTOMY      Home Medications:  Allergies as of 08/02/2018      Reactions   Macrobid [nitrofurantoin Macrocrystal] Nausea And Vomiting, Rash   Tdap [tetanus-diphth-acell Pertussis] Swelling, Rash      Medication List        Accurate as of 08/02/18  3:34 PM. Always use your most recent med list.          cephALEXin 500 MG capsule Commonly known as:  KEFLEX cephalexin 500 mg capsule   CHEWABLES MULTIVITAMIN PO Take by mouth.   fluconazole 100 MG tablet Commonly known as:  DIFLUCAN fluconazole 100 mg tablet       Allergies:  Allergies  Allergen Reactions  . Macrobid [Nitrofurantoin Macrocrystal] Nausea And Vomiting and Rash  . Tdap  [Tetanus-Diphth-Acell Pertussis] Swelling and Rash    Family History: Family History  Problem Relation Age of Onset  . Migraines Neg Hx     Social History:  reports that she has never smoked. She has never used smokeless tobacco. She reports that she does not drink alcohol or use drugs.  ROS: UROLOGY Frequent Urination?: Yes Hard to postpone urination?: Yes Burning/pain with urination?: Yes Get up at night to urinate?: Yes Leakage of urine?: Yes Urine stream starts and stops?: No Trouble starting stream?: No Do you have to strain to urinate?: Yes Blood in urine?: No Urinary tract infection?: Yes Sexually transmitted disease?: No Injury to kidneys or bladder?: No Painful intercourse?: Yes Weak stream?: No Currently pregnant?: No Vaginal bleeding?: No Last menstrual period?: Unknown  Gastrointestinal Nausea?: No Vomiting?: No Indigestion/heartburn?: No Diarrhea?: No Constipation?: Yes  Constitutional Fever: No Night sweats?: Yes Weight loss?: No Fatigue?: Yes  Skin Skin rash/lesions?: Yes Itching?: Yes  Eyes Blurred vision?: Yes Double vision?: No  Ears/Nose/Throat Sore throat?: No Sinus problems?: Yes  Hematologic/Lymphatic Swollen glands?: No Easy bruising?: Yes  Cardiovascular Leg swelling?: No Chest pain?: No  Respiratory Cough?: No Shortness of breath?: No  Endocrine Excessive thirst?: No  Musculoskeletal Back pain?: No Joint pain?: No  Neurological Headaches?: Yes Dizziness?: No  Psychologic Depression?: No Anxiety?: No  Physical Exam: BP 97/66 (BP Location: Left Arm, Patient Position: Sitting, Cuff Size: Large)   Pulse (!) 106   Ht 5\' 5"  (1.651 m)  Wt 174 lb 6.4 oz (79.1 kg)   BMI 29.02 kg/m   Constitutional:  Alert and oriented, No acute distress. HEENT: [place] AT, moist mucus membranes.  Trachea midline, no masses. Cardiovascular: No clubbing, cyanosis, or edema. Respiratory: Normal respiratory effort, no increased work  of breathing. GI: Abdomen is soft, nontender, nondistended, no abdominal masses GU: No CVA tenderness Lymph: No cervical or inguinal lymphadenopathy. Skin: No rashes, bruises or suspicious lesions. Neurologic: Grossly intact, no focal deficits, moving all 4 extremities. Psychiatric: Normal mood and affect.   Assessment & Plan:    1. Incomplete bladder emptying PVR by bladder scan today is stable at 68 mL.  2. IC (interstitial cystitis) She requested Rx for Uribel.  Will also give a trial of Myrbetriq for her urinary frequency.  She was given 25 mg samples.  3.  Recurrent UTI We will continue demand therapy with cephalexin and fluconazole.  4.  Stress urinary incontinence Discussed pelvic floor physical therapy which may also improve her storage related voiding symptoms.  She would like to think this over and will call back if interested in pursuing.  Continue annual follow-up.   Abbie Sons, [place] [address], [place], [place] [address] [phone]

## 2018-08-05 ENCOUNTER — Encounter: Payer: Self-pay | Admitting: Urology

## 2018-08-05 DIAGNOSIS — N3010 Interstitial cystitis (chronic) without hematuria: Secondary | ICD-10-CM | POA: Insufficient documentation

## 2018-08-05 DIAGNOSIS — R339 Retention of urine, unspecified: Secondary | ICD-10-CM | POA: Insufficient documentation

## 2018-08-05 DIAGNOSIS — R300 Dysuria: Secondary | ICD-10-CM | POA: Insufficient documentation

## 2018-08-05 MED ORDER — URIBEL 118 MG PO CAPS: 1.0000 | ORAL_CAPSULE | Freq: Three times a day (TID) | ORAL | 1 refills | Status: DC | PRN

## 2018-08-05 MED ORDER — FLUCONAZOLE 100 MG PO TABS: 100.0000 mg | ORAL_TABLET | Freq: Every day | ORAL | 3 refills | Status: AC

## 2018-08-05 MED ORDER — CEPHALEXIN 500 MG PO CAPS: 500.0000 mg | ORAL_CAPSULE | Freq: Three times a day (TID) | ORAL | 3 refills | Status: AC

## 2018-09-20 DIAGNOSIS — H43813 Vitreous degeneration, bilateral: Secondary | ICD-10-CM | POA: Diagnosis not present

## 2018-10-20 DIAGNOSIS — M4807 Spinal stenosis, lumbosacral region: Secondary | ICD-10-CM | POA: Diagnosis not present

## 2018-10-20 DIAGNOSIS — M5387 Other specified dorsopathies, lumbosacral region: Secondary | ICD-10-CM | POA: Diagnosis not present

## 2018-10-22 DIAGNOSIS — M4807 Spinal stenosis, lumbosacral region: Secondary | ICD-10-CM | POA: Diagnosis not present

## 2018-10-22 DIAGNOSIS — M5387 Other specified dorsopathies, lumbosacral region: Secondary | ICD-10-CM | POA: Diagnosis not present

## 2018-10-26 DIAGNOSIS — M5387 Other specified dorsopathies, lumbosacral region: Secondary | ICD-10-CM | POA: Diagnosis not present

## 2018-10-26 DIAGNOSIS — M9903 Segmental and somatic dysfunction of lumbar region: Secondary | ICD-10-CM | POA: Diagnosis not present

## 2019-05-13 DIAGNOSIS — H2511 Age-related nuclear cataract, right eye: Secondary | ICD-10-CM | POA: Diagnosis not present

## 2019-05-30 DIAGNOSIS — H25043 Posterior subcapsular polar age-related cataract, bilateral: Secondary | ICD-10-CM | POA: Diagnosis not present

## 2019-07-20 DIAGNOSIS — H25041 Posterior subcapsular polar age-related cataract, right eye: Secondary | ICD-10-CM | POA: Diagnosis not present

## 2019-07-28 ENCOUNTER — Encounter: Payer: Self-pay | Admitting: *Deleted

## 2019-07-28 ENCOUNTER — Other Ambulatory Visit: Payer: Self-pay

## 2019-07-29 ENCOUNTER — Other Ambulatory Visit
Admission: RE | Admit: 2019-07-29 | Discharge: 2019-07-29 | Disposition: A | Discharge status: Home / Self Care | Payer: 59 | Source: Ambulatory Visit | Attending: Ophthalmology | Admitting: Ophthalmology

## 2019-07-29 DIAGNOSIS — Z01812 Encounter for preprocedural laboratory examination: Secondary | ICD-10-CM | POA: Insufficient documentation

## 2019-07-29 DIAGNOSIS — Z20828 Contact with and (suspected) exposure to other viral communicable diseases: Secondary | ICD-10-CM | POA: Insufficient documentation

## 2019-07-29 LAB — SARS CORONAVIRUS 2 (TAT 6-24 HRS): SARS Coronavirus 2: NEGATIVE

## 2019-07-29 NOTE — Discharge Instructions (Signed)

## 2019-08-03 ENCOUNTER — Ambulatory Visit: Payer: 59 | Admitting: Anesthesiology

## 2019-08-03 ENCOUNTER — Other Ambulatory Visit: Payer: Self-pay

## 2019-08-03 ENCOUNTER — Encounter
Admission: RE | Disposition: A | Discharge status: Home / Self Care | Payer: Self-pay | Source: Home / Self Care | Attending: Ophthalmology

## 2019-08-03 ENCOUNTER — Ambulatory Visit: Payer: BLUE CROSS/BLUE SHIELD | Admitting: Urology

## 2019-08-03 ENCOUNTER — Ambulatory Visit
Admission: RE | Admit: 2019-08-03 | Discharge: 2019-08-03 | Disposition: A | Discharge status: Home / Self Care | Payer: 59 | Attending: Ophthalmology | Admitting: Ophthalmology

## 2019-08-03 DIAGNOSIS — H2511 Age-related nuclear cataract, right eye: Secondary | ICD-10-CM | POA: Diagnosis present

## 2019-08-03 DIAGNOSIS — H25041 Posterior subcapsular polar age-related cataract, right eye: Secondary | ICD-10-CM | POA: Diagnosis not present

## 2019-08-03 DIAGNOSIS — H25811 Combined forms of age-related cataract, right eye: Secondary | ICD-10-CM | POA: Diagnosis not present

## 2019-08-03 HISTORY — DX: Other complications of anesthesia, initial encounter: T88.59XA

## 2019-08-03 HISTORY — PX: CATARACT EXTRACTION W/PHACO: SHX586

## 2019-08-03 LAB — POCT PREGNANCY, URINE: Preg Test, Ur: NEGATIVE

## 2019-08-03 SURGERY — PHACOEMULSIFICATION, CATARACT, WITH IOL INSERTION
Anesthesia: Monitor Anesthesia Care | Site: Eye | Laterality: Right

## 2019-08-03 MED ORDER — MOXIFLOXACIN HCL 0.5 % OP SOLN
1.0000 [drp] | OPHTHALMIC | Status: DC | PRN
  Administered 2019-08-03 (×3): 1 [drp] via OPHTHALMIC

## 2019-08-03 MED ORDER — ACETAMINOPHEN 160 MG/5ML PO SOLN: 325.0000 mg | ORAL | Status: DC | PRN

## 2019-08-03 MED ORDER — ARMC OPHTHALMIC DILATING DROPS
1.0000 "application " | OPHTHALMIC | Status: DC | PRN
  Administered 2019-08-03 (×3): 1 via OPHTHALMIC

## 2019-08-03 MED ORDER — EPINEPHRINE PF 1 MG/ML IJ SOLN
INTRAOCULAR | Status: DC | PRN
  Administered 2019-08-03: 61 mL via OPHTHALMIC

## 2019-08-03 MED ORDER — FENTANYL CITRATE (PF) 100 MCG/2ML IJ SOLN
INTRAMUSCULAR | Status: DC | PRN
  Administered 2019-08-03: 100 ug via INTRAVENOUS

## 2019-08-03 MED ORDER — ACETAMINOPHEN 325 MG PO TABS
325.0000 mg | ORAL_TABLET | ORAL | Status: DC | PRN
  Administered 2019-08-03: 09:00:00 650 mg via ORAL

## 2019-08-03 MED ORDER — LIDOCAINE HCL (PF) 2 % IJ SOLN
INTRAOCULAR | Status: DC | PRN
  Administered 2019-08-03: 1 mL

## 2019-08-03 MED ORDER — NA HYALUR & NA CHOND-NA HYALUR 0.4-0.35 ML IO KIT
PACK | INTRAOCULAR | Status: DC | PRN
  Administered 2019-08-03: 1 mL via INTRAOCULAR

## 2019-08-03 MED ORDER — MIDAZOLAM HCL 2 MG/2ML IJ SOLN
INTRAMUSCULAR | Status: DC | PRN
  Administered 2019-08-03: 2 mg via INTRAVENOUS

## 2019-08-03 MED ORDER — TETRACAINE HCL 0.5 % OP SOLN
1.0000 [drp] | OPHTHALMIC | Status: DC | PRN
  Administered 2019-08-03 (×3): 1 [drp] via OPHTHALMIC

## 2019-08-03 MED ORDER — BRIMONIDINE TARTRATE-TIMOLOL 0.2-0.5 % OP SOLN
OPHTHALMIC | Status: DC | PRN
  Administered 2019-08-03: 1 [drp] via OPHTHALMIC

## 2019-08-03 MED ORDER — CEFUROXIME OPHTHALMIC INJECTION 1 MG/0.1 ML
INJECTION | OPHTHALMIC | Status: DC | PRN
  Administered 2019-08-03: 0.1 mL via INTRACAMERAL

## 2019-08-03 SURGICAL SUPPLY — 18 items
CANNULA ANT/CHMB 27G (MISCELLANEOUS) ×1 IMPLANT
CANNULA ANT/CHMB 27GA (MISCELLANEOUS) ×2 IMPLANT
GLOVE SURG LX 7.5 STRW (GLOVE) ×1
GLOVE SURG LX STRL 7.5 STRW (GLOVE) ×1 IMPLANT
GLOVE SURG TRIUMPH 8.0 PF LTX (GLOVE) ×2 IMPLANT
GOWN STRL REUS W/ TWL LRG LVL3 (GOWN DISPOSABLE) ×2 IMPLANT
GOWN STRL REUS W/TWL LRG LVL3 (GOWN DISPOSABLE) ×4
LENS IOL ACRSF IQ TRC 6 14.0 IMPLANT
LENS IOL ACRYSOF IQ TORIC 14.0 ×2 IMPLANT
LENS IOL IQ TORIC 6 14.0 ×1 IMPLANT
MARKER SKIN DUAL TIP RULER LAB (MISCELLANEOUS) ×2 IMPLANT
PACK CATARACT BRASINGTON (MISCELLANEOUS) ×2 IMPLANT
PACK EYE AFTER SURG (MISCELLANEOUS) ×2 IMPLANT
PACK OPTHALMIC (MISCELLANEOUS) ×2 IMPLANT
SYR 3ML LL SCALE MARK (SYRINGE) ×2 IMPLANT
SYR TB 1ML LUER SLIP (SYRINGE) ×2 IMPLANT
WATER STERILE IRR 500ML POUR (IV SOLUTION) ×2 IMPLANT
WIPE NON LINTING 3.25X3.25 (MISCELLANEOUS) ×2 IMPLANT

## 2019-08-03 NOTE — Transfer of Care (Signed)
Immediate Anesthesia Transfer of Care Note  Patient: Karina [name]  Procedure(s) Performed: CATARACT EXTRACTION PHACO AND INTRAOCULAR LENS PLACEMENT (IOC)  RIGHT TORIC LENS  00:21.4  13.7%  2.94 (Right Eye)  Patient Location: PACU  Anesthesia Type: MAC  Level of Consciousness: awake, alert  and patient cooperative  Airway and Oxygen Therapy: Patient Spontanous Breathing and Patient connected to supplemental oxygen  Post-op Assessment: Post-op Vital signs reviewed, Patient's Cardiovascular Status Stable, Respiratory Function Stable, Patent Airway and No signs of Nausea or vomiting  Post-op Vital Signs: Reviewed and stable  Complications: No apparent anesthesia complications

## 2019-08-03 NOTE — Anesthesia Postprocedure Evaluation (Signed)
Anesthesia Post Note  Patient: Karina [name]  Procedure(s) Performed: CATARACT EXTRACTION PHACO AND INTRAOCULAR LENS PLACEMENT (IOC)  RIGHT TORIC LENS  00:21.4  13.7%  2.94 (Right Eye)  Anesthesia Type: MAC    Wanda Plump Javius Sylla

## 2019-08-03 NOTE — Op Note (Signed)
LOCATION:  [place]   PREOPERATIVE DIAGNOSIS:  Nuclear sclerotic cataract of the right eye.  H25.11   POSTOPERATIVE DIAGNOSIS:  Nuclear sclerotic cataract of the right eye.   PROCEDURE:  Phacoemulsification with Toric posterior chamber intraocular lens placement of the right eye.   LENS:   Implant Name Type Inv. Item Serial No. Manufacturer Lot No. LRB No. Used Action  ACRYSOF IQ TORIC ASTIGMATISM IOL Intraocular Lens  [id number]   Right 1 Implanted     [id number] 14.0 Toric intraocular lens with 3.75 diopters of cylindrical power with axis orientation at 86 degrees.   ULTRASOUND TIME: 14 % of 0 minutes, 21 seconds.  CDE 2.9   SURGEON:  Wyonia Hough, MD   ANESTHESIA: Topical with tetracaine drops and 2% Xylocaine jelly, augmented with 1% preservative-free intracameral lidocaine. .   COMPLICATIONS:  None.   DESCRIPTION OF PROCEDURE:  The patient was identified in the holding room and transported to the operating suite and placed in the supine position under the operating microscope.  The right eye was identified as the operative eye, and it was prepped and draped in the usual sterile ophthalmic fashion.    A clear-corneal paracentesis incision was made at the 12:00 position.  0.5 ml of preservative-free 1% lidocaine was injected into the anterior chamber. The anterior chamber was filled with Viscoat.  A 2.4 millimeter near clear corneal incision was then made at the 9:00 position.  A cystotome and capsulorrhexis forceps were then used to make a curvilinear capsulorrhexis.  Hydrodissection and hydrodelineation were then performed using balanced salt solution.   Phacoemulsification was then used in stop and chop fashion to remove the lens, nucleus and epinucleus.  The remaining cortex was aspirated using the irrigation and aspiration handpiece.  Provisc viscoelastic was then placed into the capsular bag to distend it for lens placement.  The Verion digital marker was used  to align the implant at the intended axis.   A Toric lens was then injected into the capsular bag.  It was rotated clockwise until the axis marks on the lens were approximately 15 degrees in the counterclockwise direction to the intended alignment.  The viscoelastic was aspirated from the eye using the irrigation aspiration handpiece.  Then, a Koch spatula through the sideport incision was used to rotate the lens in a clockwise direction until the axis markings of the intraocular lens were lined up with the Verion alignment.  Balanced salt solution was then used to hydrate the wounds. Cefuroxime 0.1 ml of a 10mg /ml solution was injected into the anterior chamber for a dose of 1 mg of intracameral antibiotic at the completion of the case.    The eye was noted to have a physiologic pressure and there was no wound leak noted.   Timolol and Brimonidine drops were applied to the eye.  The patient was taken to the recovery room in stable condition having had no complications of anesthesia or surgery.  Jansel Vonstein 08/03/2019, 8:59 AM

## 2019-08-03 NOTE — H&P (Signed)

## 2019-08-03 NOTE — Anesthesia Procedure Notes (Signed)
Procedure Name: MAC Performed by: Corrie Reder, CRNA Pre-anesthesia Checklist: Patient identified, Emergency Drugs available, Suction available, Timeout performed and Patient being monitored Patient Re-evaluated:Patient Re-evaluated prior to induction Oxygen Delivery Method: Nasal cannula Placement Confirmation: positive ETCO2       

## 2019-08-03 NOTE — Anesthesia Preprocedure Evaluation (Signed)
Anesthesia Evaluation   Patient awake    Reviewed: Allergy & Precautions, NPO status , Patient's Chart, lab work & pertinent test results  History of Anesthesia Complications (+) PONV and history of anesthetic complications  Airway Mallampati: II  TM Distance: >3 FB     Dental   Pulmonary neg pulmonary ROS,    Pulmonary exam normal        Cardiovascular negative cardio ROS Normal cardiovascular exam     Neuro/Psych    GI/Hepatic negative GI ROS,   Endo/Other    Renal/GU      Musculoskeletal  (+) Arthritis ,   Abdominal   Peds  Hematology   Anesthesia Other Findings   Reproductive/Obstetrics                             Anesthesia Physical Anesthesia Plan  ASA: II  Anesthesia Plan: MAC   Post-op Pain Management:    Induction: Intravenous  PONV Risk Score and Plan:   Airway Management Planned: Nasal Cannula  Additional Equipment:   Intra-op Plan:   Post-operative Plan:   Informed Consent:   Plan Discussed with: CRNA, Anesthesiologist and Surgeon  Anesthesia Plan Comments:         Anesthesia Quick Evaluation

## 2019-08-04 ENCOUNTER — Encounter: Payer: Self-pay | Admitting: Ophthalmology

## 2019-08-12 DIAGNOSIS — H2512 Age-related nuclear cataract, left eye: Secondary | ICD-10-CM | POA: Diagnosis not present

## 2019-08-19 ENCOUNTER — Other Ambulatory Visit
Admission: RE | Admit: 2019-08-19 | Discharge: 2019-08-19 | Disposition: A | Discharge status: Home / Self Care | Payer: 59 | Source: Ambulatory Visit | Attending: Ophthalmology | Admitting: Ophthalmology

## 2019-08-19 ENCOUNTER — Other Ambulatory Visit: Payer: Self-pay

## 2019-08-19 DIAGNOSIS — Z01812 Encounter for preprocedural laboratory examination: Secondary | ICD-10-CM | POA: Diagnosis not present

## 2019-08-19 DIAGNOSIS — Z20828 Contact with and (suspected) exposure to other viral communicable diseases: Secondary | ICD-10-CM | POA: Diagnosis not present

## 2019-08-19 LAB — SARS CORONAVIRUS 2 (TAT 6-24 HRS): SARS Coronavirus 2: NEGATIVE

## 2019-08-22 NOTE — Discharge Instructions (Signed)
General Anesthesia, Adult, Care After °This sheet gives you information about how to care for yourself after your procedure. Your health care provider may also give you more specific instructions. If you have problems or questions, contact your health care provider. °What can I expect after the procedure? °After the procedure, the following side effects are common: °· Pain or discomfort at the IV site. °· Nausea. °· Vomiting. °· Sore throat. °· Trouble concentrating. °· Feeling cold or chills. °· Weak or tired. °· Sleepiness and fatigue. °· Soreness and body aches. These side effects can affect parts of the body that were not involved in surgery. °Follow these instructions at home: ° °For at least 24 hours after the procedure: °· Have a responsible adult stay with you. It is important to have someone help care for you until you are awake and alert. °· Rest as needed. °· Do not: °? Participate in activities in which you could fall or become injured. °? Drive. °? Use heavy machinery. °? Drink alcohol. °? Take sleeping pills or medicines that cause drowsiness. °? Make important decisions or sign legal documents. °? Take care of children on your own. °Eating and drinking °· Follow any instructions from your health care provider about eating or drinking restrictions. °· When you feel hungry, start by eating small amounts of foods that are soft and easy to digest (bland), such as toast. Gradually return to your regular diet. °· Drink enough fluid to keep your urine pale yellow. °· If you vomit, rehydrate by drinking water, juice, or clear broth. °General instructions °· If you have sleep apnea, surgery and certain medicines can increase your risk for breathing problems. Follow instructions from your health care provider about wearing your sleep device: °? Anytime you are sleeping, including during daytime naps. °? While taking prescription pain medicines, sleeping medicines, or medicines that make you drowsy. °· Return to  your normal activities as told by your health care provider. Ask your health care provider what activities are safe for you. °· Take over-the-counter and prescription medicines only as told by your health care provider. °· If you smoke, do not smoke without supervision. °· Keep all follow-up visits as told by your health care provider. This is important. °Contact a health care provider if: °· You have nausea or vomiting that does not get better with medicine. °· You cannot eat or drink without vomiting. °· You have pain that does not get better with medicine. °· You are unable to pass urine. °· You develop a skin rash. °· You have a fever. °· You have redness around your IV site that gets worse. °Get help right away if: °· You have difficulty breathing. °· You have chest pain. °· You have blood in your urine or stool, or you vomit blood. °Summary °· After the procedure, it is common to have a sore throat or nausea. It is also common to feel tired. °· Have a responsible adult stay with you for the first 24 hours after general anesthesia. It is important to have someone help care for you until you are awake and alert. °· When you feel hungry, start by eating small amounts of foods that are soft and easy to digest (bland), such as toast. Gradually return to your regular diet. °· Drink enough fluid to keep your urine pale yellow. °· Return to your normal activities as told by your health care provider. Ask your health care provider what activities are safe for you. °This information is not   intended to replace advice given to you by your health care provider. Make sure you discuss any questions you have with your health care provider. °Document Released: 03/02/2001 Document Revised: 11/27/2017 Document Reviewed: 07/10/2017 °Elsevier Patient Education © 2020 [employer] °Cataract Surgery, Care After °This sheet gives you information about how to care for yourself after your procedure. Your health care provider may also  give you more specific instructions. If you have problems or questions, contact your health care provider. °What can I expect after the procedure? °After the procedure, it is common to have: °· Itching. °· Discomfort. °· Fluid discharge. °· Sensitivity to light and to touch. °· Bruising in or around the eye. °· Mild blurred vision. °Follow these instructions at home: °Eye care ° °· Do not touch or rub your eyes. °· Protect your eyes as told by your health care provider. You may be told to wear a protective eye shield or sunglasses. °· Do not put a contact lens into the affected eye or eyes until your health care provider approves. °· Keep the area around your eye clean and dry: °? Avoid swimming. °? Do not allow water to hit you directly in the face while showering. °? Keep soap and shampoo out of your eyes. °· Check your eye every day for signs of infection. Watch for: °? Redness, swelling, or pain. °? Fluid, blood, or pus. °? Warmth. °? A bad smell. °? Vision that is getting worse. °? Sensitivity that is getting worse. °Activity °· Do not drive for 24 hours if you were given a sedative during your procedure. °· Avoid strenuous activities, such as playing contact sports, for as long as told by your health care provider. °· Do not drive or use heavy machinery until your health care provider approves. °· Do not bend or lift heavy objects. Bending increases pressure in the eye. You can walk, climb stairs, and do light household chores. °· Ask your health care provider when you can return to work. If you work in a dusty environment, you may be advised to wear protective eyewear for a period of time. °General instructions °· Take or apply over-the-counter and prescription medicines only as told by your health care provider. This includes eye drops. °· Keep all follow-up visits as told by your health care provider. This is important. °Contact a health care provider if: °· You have increased bruising around your  eye. °· You have pain that is not helped with medicine. °· You have a fever. °· You have redness, swelling, or pain in your eye. °· You have fluid, blood, or pus coming from your incision. °· Your vision gets worse. °· Your sensitivity to light gets worse. °Get help right away if: °· You have sudden loss of vision. °· You see flashes of light or spots (floaters). °· You have severe eye pain. °· You develop nausea or vomiting. °Summary °· After your procedure, it is common to have itching, discomfort, bruising, fluid discharge, or sensitivity to light. °· Follow instructions from your health care provider about caring for your eye after the procedure. °· Do not rub your eye after the procedure. You may need to wear eye protection or sunglasses. Do not wear contact lenses. Keep the area around your eye clean and dry. °· Avoid activities that require a lot of effort. These include playing sports and lifting heavy objects. °· Contact a health care provider if you have increased bruising, pain that does not go away, or a fever. Get   help right away if you suddenly lose your vision, see flashes of light or spots, or have severe pain in the eye. °This information is not intended to replace advice given to you by your health care provider. Make sure you discuss any questions you have with your health care provider. °Document Released: 06/13/2005 Document Revised: 05/24/2018 Document Reviewed: 05/24/2018 °Elsevier Patient Education © 2020 Elsevier Inc. ° °

## 2019-08-24 ENCOUNTER — Ambulatory Visit: Payer: 59 | Admitting: Anesthesiology

## 2019-08-24 ENCOUNTER — Other Ambulatory Visit: Payer: Self-pay

## 2019-08-24 ENCOUNTER — Ambulatory Visit
Admission: RE | Admit: 2019-08-24 | Discharge: 2019-08-24 | Disposition: A | Discharge status: Home / Self Care | Payer: 59 | Attending: Ophthalmology | Admitting: Ophthalmology

## 2019-08-24 ENCOUNTER — Encounter
Admission: RE | Disposition: A | Discharge status: Home / Self Care | Payer: Self-pay | Source: Home / Self Care | Attending: Ophthalmology

## 2019-08-24 DIAGNOSIS — N3010 Interstitial cystitis (chronic) without hematuria: Secondary | ICD-10-CM | POA: Insufficient documentation

## 2019-08-24 DIAGNOSIS — H25812 Combined forms of age-related cataract, left eye: Secondary | ICD-10-CM | POA: Diagnosis not present

## 2019-08-24 DIAGNOSIS — H2512 Age-related nuclear cataract, left eye: Secondary | ICD-10-CM | POA: Diagnosis not present

## 2019-08-24 DIAGNOSIS — L4050 Arthropathic psoriasis, unspecified: Secondary | ICD-10-CM | POA: Diagnosis not present

## 2019-08-24 DIAGNOSIS — Z881 Allergy status to other antibiotic agents status: Secondary | ICD-10-CM | POA: Insufficient documentation

## 2019-08-24 HISTORY — PX: CATARACT EXTRACTION W/PHACO: SHX586

## 2019-08-24 SURGERY — PHACOEMULSIFICATION, CATARACT, WITH IOL INSERTION
Anesthesia: Monitor Anesthesia Care | Site: Eye | Laterality: Left

## 2019-08-24 MED ORDER — CEFUROXIME OPHTHALMIC INJECTION 1 MG/0.1 ML
INJECTION | OPHTHALMIC | Status: DC | PRN
  Administered 2019-08-24: 0.1 mL via INTRACAMERAL

## 2019-08-24 MED ORDER — BRIMONIDINE TARTRATE-TIMOLOL 0.2-0.5 % OP SOLN
OPHTHALMIC | Status: DC | PRN
  Administered 2019-08-24: 1 [drp] via OPHTHALMIC

## 2019-08-24 MED ORDER — ONDANSETRON HCL 4 MG/2ML IJ SOLN
4.0000 mg | Freq: Once | INTRAMUSCULAR | Status: AC | PRN
  Administered 2019-08-24: 4 mg via INTRAVENOUS

## 2019-08-24 MED ORDER — EPINEPHRINE PF 1 MG/ML IJ SOLN
INTRAOCULAR | Status: DC | PRN
  Administered 2019-08-24: 10:00:00 31 mL via OPHTHALMIC

## 2019-08-24 MED ORDER — FENTANYL CITRATE (PF) 100 MCG/2ML IJ SOLN
INTRAMUSCULAR | Status: DC | PRN
  Administered 2019-08-24: 100 ug via INTRAVENOUS

## 2019-08-24 MED ORDER — MOXIFLOXACIN HCL 0.5 % OP SOLN
1.0000 [drp] | OPHTHALMIC | Status: DC | PRN
  Administered 2019-08-24 (×3): 1 [drp] via OPHTHALMIC

## 2019-08-24 MED ORDER — NA HYALUR & NA CHOND-NA HYALUR 0.4-0.35 ML IO KIT
PACK | INTRAOCULAR | Status: DC | PRN
  Administered 2019-08-24: 1 mL via INTRAOCULAR

## 2019-08-24 MED ORDER — ARMC OPHTHALMIC DILATING DROPS
1.0000 "application " | OPHTHALMIC | Status: DC | PRN
  Administered 2019-08-24 (×3): 1 via OPHTHALMIC

## 2019-08-24 MED ORDER — LIDOCAINE HCL (PF) 2 % IJ SOLN
INTRAOCULAR | Status: DC | PRN
  Administered 2019-08-24: 10:00:00 1 mL

## 2019-08-24 MED ORDER — ACETAMINOPHEN 325 MG PO TABS: 325.0000 mg | ORAL_TABLET | ORAL | Status: DC | PRN

## 2019-08-24 MED ORDER — DIPHENHYDRAMINE HCL 50 MG/ML IJ SOLN
INTRAMUSCULAR | Status: DC | PRN
  Administered 2019-08-24: 12.5 mg via INTRAVENOUS

## 2019-08-24 MED ORDER — TETRACAINE HCL 0.5 % OP SOLN
1.0000 [drp] | OPHTHALMIC | Status: DC | PRN
  Administered 2019-08-24 (×3): 1 [drp] via OPHTHALMIC

## 2019-08-24 MED ORDER — MIDAZOLAM HCL 2 MG/2ML IJ SOLN
INTRAMUSCULAR | Status: DC | PRN
  Administered 2019-08-24: 2 mg via INTRAVENOUS

## 2019-08-24 MED ORDER — ACETAMINOPHEN 160 MG/5ML PO SOLN: 325.0000 mg | ORAL | Status: DC | PRN

## 2019-08-24 SURGICAL SUPPLY — 28 items
CANNULA ANT/CHMB 27G (MISCELLANEOUS) ×1 IMPLANT
CANNULA ANT/CHMB 27GA (MISCELLANEOUS) ×2 IMPLANT
GLOVE SURG LX 7.5 STRW (GLOVE) ×2
GLOVE SURG LX STRL 7.5 STRW (GLOVE) ×1 IMPLANT
GLOVE SURG TRIUMPH 8.0 PF LTX (GLOVE) ×2 IMPLANT
GOWN STRL REUS W/ TWL LRG LVL3 (GOWN DISPOSABLE) ×2 IMPLANT
GOWN STRL REUS W/TWL LRG LVL3 (GOWN DISPOSABLE) ×4
LENS IOL ACRSF IQ TRC 3 14.0 IMPLANT
LENS IOL ACRYSOF IQ TORIC 14.0 ×2 IMPLANT
LENS IOL IQ TORIC 3 14.0 ×1 IMPLANT
MARKER SKIN DUAL TIP RULER LAB (MISCELLANEOUS) ×2 IMPLANT
NDL FILTER BLUNT 18X1 1/2 (NEEDLE) IMPLANT
NDL RETROBULBAR .5 NSTRL (NEEDLE) IMPLANT
NEEDLE FILTER BLUNT 18X 1/2SAF (NEEDLE)
NEEDLE FILTER BLUNT 18X1 1/2 (NEEDLE) IMPLANT
PACK CATARACT BRASINGTON (MISCELLANEOUS) ×2 IMPLANT
PACK EYE AFTER SURG (MISCELLANEOUS) ×2 IMPLANT
PACK OPTHALMIC (MISCELLANEOUS) ×2 IMPLANT
RING MALYGIN 7.0 (MISCELLANEOUS) IMPLANT
SUT ETHILON 10-0 CS-B-6CS-B-6 (SUTURE)
SUT VICRYL  9 0 (SUTURE)
SUT VICRYL 9 0 (SUTURE) IMPLANT
SUTURE EHLN 10-0 CS-B-6CS-B-6 (SUTURE) IMPLANT
SYR 3ML LL SCALE MARK (SYRINGE) ×2 IMPLANT
SYR 5ML LL (SYRINGE) IMPLANT
SYR TB 1ML LUER SLIP (SYRINGE) ×2 IMPLANT
WATER STERILE IRR 500ML POUR (IV SOLUTION) ×2 IMPLANT
WIPE NON LINTING 3.25X3.25 (MISCELLANEOUS) ×2 IMPLANT

## 2019-08-24 NOTE — Op Note (Signed)
LOCATION:  [facility]   PREOPERATIVE DIAGNOSIS:  Nuclear sclerotic cataract of the left eye.  H25.12  POSTOPERATIVE DIAGNOSIS:  Nuclear sclerotic cataract of the left eye.   PROCEDURE:  Phacoemulsification with Toric posterior chamber intraocular lens placement of the left eye.  Ultrasound time: Procedure(s): CATARACT EXTRACTION PHACO AND INTRAOCULAR LENS PLACEMENT ([facility]) LEFT TORIC LENS  0:20 13.6% 2.82 (Left) LENS:[id number] 14.0 D Toric intraocular lens with 1.5 diopters of cylindrical power with axis orientation at 1147 degrees.   SURGEON:  Wyonia Hough, MD   ANESTHESIA:  Topical with tetracaine drops and 2% Xylocaine jelly, augmented with 1% preservative-free intracameral lidocaine.  COMPLICATIONS:  None.   DESCRIPTION OF PROCEDURE:  The patient was identified in the holding room and transported to the operating suite and placed in the supine position under the operating microscope.  The left eye was identified as the operative eye, and it was prepped and draped in the usual sterile ophthalmic fashion.    A clear-corneal paracentesis incision was made at the 1:30 position.  0.5 ml of preservative-free 1% lidocaine was injected into the anterior chamber. The anterior chamber was filled with Viscoat.  A 2.4 millimeter near clear corneal incision was then made at the 10:30 position.  A cystotome and capsulorrhexis forceps were then used to make a curvilinear capsulorrhexis.  Hydrodissection and hydrodelineation were then performed using balanced salt solution.   Phacoemulsification was then used in stop and chop fashion to remove the lens, nucleus and epinucleus.  The remaining cortex was aspirated using the irrigation and aspiration handpiece.  Provisc viscoelastic was then placed into the capsular bag to distend it for lens placement.  The Verion digital marker was used to align the implant at the intended axis.   A 14.0 diopter lens was then injected into the capsular  bag.  It was rotated clockwise until the axis marks on the lens were approximately 15 degrees in the counterclockwise direction to the intended alignment.  The viscoelastic was aspirated from the eye using the irrigation aspiration handpiece.  Then, a Koch spatula through the sideport incision was used to rotate the lens in a clockwise direction until the axis markings of the intraocular lens were lined up with the Verion alignment.  Balanced salt solution was then used to hydrate the wounds. Cefuroxime 0.1 ml of a 10mg /ml solution was injected into the anterior chamber for a dose of 1 mg of intracameral antibiotic at the completion of the case.    The eye was noted to have a physiologic pressure and there was no wound leak noted.   Timolol and Brimonidine drops were applied to the eye.  The patient was taken to the recovery room in stable condition having had no complications of anesthesia or surgery.  [name] 08/24/2019, 10:31 AM

## 2019-08-24 NOTE — Anesthesia Procedure Notes (Signed)
Procedure Name: MAC Performed by: Charlye Spare M, CRNA Pre-anesthesia Checklist: Patient identified, Emergency Drugs available, Suction available, Patient being monitored and Timeout performed Patient Re-evaluated:Patient Re-evaluated prior to induction Oxygen Delivery Method: Nasal cannula       

## 2019-08-24 NOTE — Anesthesia Procedure Notes (Signed)
Procedure Name: MAC Performed by: Izetta Dakin, CRNA Pre-anesthesia Checklist: Patient identified, Emergency Drugs available, Patient being monitored, Timeout performed and Suction available Patient Re-evaluated:Patient Re-evaluated prior to induction Oxygen Delivery Method: Nasal cannula

## 2019-08-24 NOTE — Anesthesia Postprocedure Evaluation (Signed)
Anesthesia Post Note  Patient: Karina [name]  Procedure(s) Performed: CATARACT EXTRACTION PHACO AND INTRAOCULAR LENS PLACEMENT ([facility]) LEFT TORIC LENS  0:20 13.6% 2.82 (Left Eye)  Patient location during evaluation: PACU Anesthesia Type: MAC Level of consciousness: awake and alert Pain management: pain level controlled Vital Signs Assessment: post-procedure vital signs reviewed and stable Respiratory status: spontaneous breathing, nonlabored ventilation, respiratory function stable and patient connected to nasal cannula oxygen Cardiovascular status: stable and blood pressure returned to baseline Postop Assessment: no apparent nausea or vomiting Anesthetic complications: no    Alisa Graff

## 2019-08-24 NOTE — Anesthesia Preprocedure Evaluation (Addendum)
Anesthesia Evaluation  Patient identified by MRN, date of birth, ID band Patient awake    Reviewed: Allergy & Precautions, H&P , NPO status , Patient's Chart, lab work & pertinent test results, reviewed documented beta blocker date and time   History of Anesthesia Complications (+) PONV and history of anesthetic complications  Airway Mallampati: II  TM Distance: >3 FB Neck ROM: full    Dental no notable dental hx.    Pulmonary neg pulmonary ROS,    Pulmonary exam normal breath sounds clear to auscultation       Cardiovascular Exercise Tolerance: Good negative cardio ROS Normal cardiovascular exam Rhythm:regular Rate:Normal     Neuro/Psych negative neurological ROS  negative psych ROS   GI/Hepatic negative GI ROS, Neg liver ROS,   Endo/Other  negative endocrine ROS  Renal/GU negative Renal ROS Bladder dysfunction (interstitial cystitis)      Musculoskeletal  (+) Arthritis ,   Abdominal   Peds  Hematology negative hematology ROS (+)   Anesthesia Other Findings   Reproductive/Obstetrics negative OB ROS                            Anesthesia Physical  Anesthesia Plan  ASA: II  Anesthesia Plan: MAC   Post-op Pain Management:    Induction: Intravenous  PONV Risk Score and Plan:   Airway Management Planned: Nasal Cannula  Additional Equipment:   Intra-op Plan:   Post-operative Plan:   Informed Consent: I have reviewed the patients History and Physical, chart, labs and discussed the procedure including the risks, benefits and alternatives for the proposed anesthesia with the patient or authorized representative who has indicated his/her understanding and acceptance.     Dental Advisory Given  Plan Discussed with: CRNA, Anesthesiologist and Surgeon  Anesthesia Plan Comments:        Anesthesia Quick Evaluation

## 2019-08-24 NOTE — Anesthesia Procedure Notes (Signed)
Procedure Name: MAC Performed by: Izetta Dakin, CRNA Pre-anesthesia Checklist: Patient identified, Emergency Drugs available, Suction available, Patient being monitored and Timeout performed Patient Re-evaluated:Patient Re-evaluated prior to induction Oxygen Delivery Method: Nasal cannula

## 2019-08-24 NOTE — Transfer of Care (Addendum)
Immediate Anesthesia Transfer of Care Note  Patient: Karina [name]  Procedure(s) Performed: CATARACT EXTRACTION PHACO AND INTRAOCULAR LENS PLACEMENT ([facility]) LEFT TORIC LENS  0:20 13.6% 2.82 (Left Eye)  Patient Location: PACU  Anesthesia Type: MAC  Level of Consciousness: awake, alert  and patient cooperative  Airway and Oxygen Therapy: Patient Spontanous Breathing and Patient connected to supplemental oxygen  Post-op Assessment: Post-op Vital signs reviewed, Patient's Cardiovascular Status Stable, Respiratory Function Stable, Patent Airway and No signs of Nausea or vomiting  Post-op Vital Signs: Reviewed and stable  Complications: No apparent anesthesia complications

## 2019-08-24 NOTE — H&P (Signed)

## 2019-08-25 ENCOUNTER — Encounter: Payer: Self-pay | Admitting: Ophthalmology

## 2019-08-25 DIAGNOSIS — H04123 Dry eye syndrome of bilateral lacrimal glands: Secondary | ICD-10-CM | POA: Diagnosis not present

## 2019-08-30 ENCOUNTER — Ambulatory Visit: Payer: BLUE CROSS/BLUE SHIELD | Admitting: Urology

## 2019-08-31 ENCOUNTER — Ambulatory Visit: Payer: Self-pay | Admitting: Urology

## 2019-09-22 ENCOUNTER — Encounter: Payer: Self-pay | Admitting: Urology

## 2019-09-22 ENCOUNTER — Ambulatory Visit (INDEPENDENT_AMBULATORY_CARE_PROVIDER_SITE_OTHER): Payer: 59 | Admitting: Urology

## 2019-09-22 ENCOUNTER — Other Ambulatory Visit: Payer: Self-pay

## 2019-09-22 VITALS — BP 135/82 | HR 114 | Ht 64.0 in | Wt 175.0 lb

## 2019-09-22 DIAGNOSIS — R300 Dysuria: Secondary | ICD-10-CM | POA: Diagnosis not present

## 2019-09-22 DIAGNOSIS — R339 Retention of urine, unspecified: Secondary | ICD-10-CM

## 2019-09-22 DIAGNOSIS — N3010 Interstitial cystitis (chronic) without hematuria: Secondary | ICD-10-CM | POA: Diagnosis not present

## 2019-09-22 LAB — URINALYSIS, COMPLETE
Bilirubin, UA: NEGATIVE
Glucose, UA: NEGATIVE
Ketones, UA: NEGATIVE
Leukocytes,UA: NEGATIVE
Nitrite, UA: NEGATIVE
Protein,UA: NEGATIVE
RBC, UA: NEGATIVE
Specific Gravity, UA: 1.015 (ref 1.005–1.030)
Urobilinogen, Ur: 0.2 mg/dL (ref 0.2–1.0)
pH, UA: 6 (ref 5.0–7.5)

## 2019-09-22 LAB — MICROSCOPIC EXAMINATION
RBC, Urine: NONE SEEN /hpf (ref 0–2)
WBC, UA: NONE SEEN /hpf (ref 0–5)

## 2019-09-22 LAB — BLADDER SCAN AMB NON-IMAGING

## 2019-09-22 MED ORDER — FLUCONAZOLE 100 MG PO TABS: 100.0000 mg | ORAL_TABLET | ORAL | 3 refills | Status: DC

## 2019-09-22 MED ORDER — MIRABEGRON ER 50 MG PO TB24: 50.0000 mg | ORAL_TABLET | Freq: Every day | ORAL | 3 refills | Status: DC

## 2019-09-22 MED ORDER — CEPHALEXIN 500 MG PO CAPS: 500.0000 mg | ORAL_CAPSULE | Freq: Three times a day (TID) | ORAL | 3 refills | Status: DC

## 2019-09-22 MED ORDER — URIBEL 118 MG PO CAPS: 1.0000 | ORAL_CAPSULE | Freq: Three times a day (TID) | ORAL | 0 refills | Status: DC | PRN

## 2019-09-22 NOTE — Progress Notes (Signed)
09/22/2019 11:13 AM   Karina [name] 1970/12/27 [id number]  Referring provider: No referring provider defined for this encounter.  Chief Complaint  Patient presents with  . Follow-up    HPI: 48 y.o. female presents for follow-up.  She transferred care from Dr. Jacqlyn Larsen and I initially saw her in August 2019.  She was followed for interstitial cystitis, recurrent urinary tract infections and stress urinary incontinence.  She has been on demand therapy for recurrent UTIs with cephalexin and due to high incidence of yeast vaginitis also takes fluconazole.  Since her last visit she has not had to utilize this medication.  Her symptoms were primarily controlled with Uribel however it was not covered by her insurance and she has not taken.  She has recently changed insurance and would like a prescription.  She has recently noted mild suprapubic tenderness.  Denies dysuria or gross hematuria.  She does have urinary frequency and nocturia x2.  Her stress incontinence is stable.  At our last visit she was given Myrbetriq samples for storage elated voiding symptoms which she states was effective and she only had to take as needed.  She also requested a prescription for Myrbetriq.  PMH: Past Medical History:  Diagnosis Date  . Complication of anesthesia    facial swelling after 1 bladder procedure  . Interstitial cystitis   . Psoriasis   . Psoriatic arthritis [facility])     Surgical History: Past Surgical History:  Procedure Laterality Date  . bladder hydrodistentions    . CATARACT EXTRACTION W/PHACO Right 08/03/2019   Procedure: CATARACT EXTRACTION PHACO AND INTRAOCULAR LENS PLACEMENT ([place])  RIGHT TORIC LENS  00:21.4  13.7%  2.94;  Surgeon: Leandrew Koyanagi, MD;  Location: [place];  Service: Ophthalmology;  Laterality: Right;  . CATARACT EXTRACTION W/PHACO Left 08/24/2019   Procedure: CATARACT EXTRACTION PHACO AND INTRAOCULAR LENS PLACEMENT ([place]) LEFT TORIC LENS  0:20 13.6%  2.82;  Surgeon: Leandrew Koyanagi, MD;  Location: [place];  Service: Ophthalmology;  Laterality: Left;  . CHOLECYSTECTOMY      Home Medications:  Allergies as of 09/22/2019      Reactions   Macrobid [nitrofurantoin Macrocrystal] Nausea And Vomiting, Rash   Tdap [tetanus-diphth-acell Pertussis] Swelling, Rash      Medication List       Accurate as of September 22, 2019 11:59 PM. If you have any questions, ask your nurse or doctor.        cephALEXin 500 MG capsule Commonly known as: Keflex Take 1 capsule (500 mg total) by mouth 3 (three) times daily. Started by: Abbie Sons, MD   CHEWABLES MULTIVITAMIN PO Take by mouth.   fluconazole 100 MG tablet Commonly known as: DIFLUCAN Take 1 tablet (100 mg total) by mouth every other day. X 7 days Started by: Abbie Sons, MD   mirabegron ER 50 MG Tb24 tablet Commonly known as: MYRBETRIQ Take 1 tablet (50 mg total) by mouth daily. Started by: Abbie Sons, MD   SYSTANE OP Apply to eye.   Uribel 118 MG Caps Take 1 capsule (118 mg total) by mouth 3 (three) times daily as needed (Urinary frequency, urgency, burning). Started by: Abbie Sons, MD   VITAMIN D3 PO Take by mouth daily.   Xiidra 5 % Soln Generic drug: Lifitegrast       Allergies:  Allergies  Allergen Reactions  . Macrobid [Nitrofurantoin Macrocrystal] Nausea And Vomiting and Rash  . Tdap [Tetanus-Diphth-Acell Pertussis] Swelling and Rash    Family  History: Family History  Problem Relation Age of Onset  . Migraines Neg Hx     Social History:  reports that she has never smoked. She has never used smokeless tobacco. She reports that she does not drink alcohol or use drugs.  ROS: UROLOGY Frequent Urination?: Yes Hard to postpone urination?: Yes Burning/pain with urination?: Yes Get up at night to urinate?: Yes Leakage of urine?: Yes Urine stream starts and stops?: Yes Trouble starting stream?: Yes Do you have to strain to  urinate?: Yes Blood in urine?: No Urinary tract infection?: Yes Sexually transmitted disease?: No Injury to kidneys or bladder?: No Painful intercourse?: No Weak stream?: No Currently pregnant?: No Vaginal bleeding?: No Last menstrual period?: N/A  Gastrointestinal Nausea?: No Vomiting?: No Indigestion/heartburn?: No Diarrhea?: No Constipation?: No  Constitutional Fever: No Night sweats?: No Weight loss?: No Fatigue?: No  Skin Skin rash/lesions?: No Itching?: No  Eyes Blurred vision?: No Double vision?: No  Ears/Nose/Throat Sore throat?: No Sinus problems?: No  Hematologic/Lymphatic Swollen glands?: No Easy bruising?: No  Cardiovascular Leg swelling?: No Chest pain?: No  Respiratory Cough?: No Shortness of breath?: No  Endocrine Excessive thirst?: No  Musculoskeletal Back pain?: No Joint pain?: No  Neurological Headaches?: No Dizziness?: No  Psychologic Depression?: No Anxiety?: No  Physical Exam: BP 135/82 (BP Location: Left Arm, Patient Position: Sitting, Cuff Size: Normal)   Pulse (!) 114   Ht 5\' 4"  (1.626 m)   Wt 175 lb (79.4 kg)   BMI 30.04 kg/m   Constitutional:  Alert and oriented, No acute distress. HEENT: [place] AT, moist mucus membranes.  Trachea midline, no masses. Cardiovascular: No clubbing, cyanosis, or edema. Respiratory: Normal respiratory effort, no increased work of breathing. Skin: No rashes, bruises or suspicious lesions. Neurologic: Grossly intact, no focal deficits, moving all 4 extremities. Psychiatric: Normal mood and affect.  Laboratory Data:  Urinalysis-dipstick/microscopy negative  Assessment & Plan:    - IC (interstitial cystitis) Stable symptoms.  She was given additional Myrbetriq samples along with an Rx.  It does look like Uribel is covered by her current plan and was given a printed Rx per her request.  Follow-up annually  - Recurrent UTI Urinalysis today was clear.  PVR by bladder scan was 8 mL.  Her  prescriptions for cephalexin and fluconazole had expired and she was given new printed prescriptions.  - Stress urinary incontinence Stable   Return in about 1 year (around 09/21/2020) for Recheck.  Abbie Sons, [place] [address], [place], [place] [address] [phone]

## 2019-09-23 ENCOUNTER — Telehealth: Payer: Self-pay

## 2019-09-23 NOTE — Telephone Encounter (Signed)
See my chart encounter.

## 2019-09-23 NOTE — Telephone Encounter (Signed)
-----   Message from Abbie Sons, MD sent at 09/23/2019  1:35 PM EDT ----- Urinalysis was clear without evidence of infection

## 2019-09-25 ENCOUNTER — Encounter: Payer: Self-pay | Admitting: Urology

## 2019-11-11 DIAGNOSIS — R69 Illness, unspecified: Secondary | ICD-10-CM | POA: Diagnosis not present

## 2019-11-11 DIAGNOSIS — Z1231 Encounter for screening mammogram for malignant neoplasm of breast: Secondary | ICD-10-CM | POA: Diagnosis not present

## 2019-11-11 DIAGNOSIS — Z01419 Encounter for gynecological examination (general) (routine) without abnormal findings: Secondary | ICD-10-CM | POA: Diagnosis not present

## 2019-11-11 DIAGNOSIS — Z6829 Body mass index (BMI) 29.0-29.9, adult: Secondary | ICD-10-CM | POA: Diagnosis not present

## 2019-11-17 DIAGNOSIS — Z13220 Encounter for screening for lipoid disorders: Secondary | ICD-10-CM | POA: Diagnosis not present

## 2019-11-17 DIAGNOSIS — Z1329 Encounter for screening for other suspected endocrine disorder: Secondary | ICD-10-CM | POA: Diagnosis not present

## 2019-11-17 DIAGNOSIS — Z130 Encounter for screening for diseases of the blood and blood-forming organs and certain disorders involving the immune mechanism: Secondary | ICD-10-CM | POA: Diagnosis not present

## 2019-11-17 DIAGNOSIS — Z0000 Encounter for general adult medical examination without abnormal findings: Secondary | ICD-10-CM | POA: Diagnosis not present

## 2019-11-17 DIAGNOSIS — Z131 Encounter for screening for diabetes mellitus: Secondary | ICD-10-CM | POA: Diagnosis not present

## 2020-01-09 DIAGNOSIS — M9903 Segmental and somatic dysfunction of lumbar region: Secondary | ICD-10-CM | POA: Diagnosis not present

## 2020-01-09 DIAGNOSIS — M9904 Segmental and somatic dysfunction of sacral region: Secondary | ICD-10-CM | POA: Diagnosis not present

## 2020-01-09 DIAGNOSIS — M5387 Other specified dorsopathies, lumbosacral region: Secondary | ICD-10-CM | POA: Diagnosis not present

## 2020-01-11 DIAGNOSIS — M9903 Segmental and somatic dysfunction of lumbar region: Secondary | ICD-10-CM | POA: Diagnosis not present

## 2020-01-11 DIAGNOSIS — M5387 Other specified dorsopathies, lumbosacral region: Secondary | ICD-10-CM | POA: Diagnosis not present

## 2020-01-11 DIAGNOSIS — M9904 Segmental and somatic dysfunction of sacral region: Secondary | ICD-10-CM | POA: Diagnosis not present

## 2020-01-28 DIAGNOSIS — M9902 Segmental and somatic dysfunction of thoracic region: Secondary | ICD-10-CM | POA: Diagnosis not present

## 2020-01-28 DIAGNOSIS — M9901 Segmental and somatic dysfunction of cervical region: Secondary | ICD-10-CM | POA: Diagnosis not present

## 2020-01-28 DIAGNOSIS — M5412 Radiculopathy, cervical region: Secondary | ICD-10-CM | POA: Diagnosis not present

## 2020-01-28 DIAGNOSIS — M62830 Muscle spasm of back: Secondary | ICD-10-CM | POA: Diagnosis not present

## 2020-01-30 DIAGNOSIS — M5412 Radiculopathy, cervical region: Secondary | ICD-10-CM | POA: Diagnosis not present

## 2020-01-30 DIAGNOSIS — M62830 Muscle spasm of back: Secondary | ICD-10-CM | POA: Diagnosis not present

## 2020-01-30 DIAGNOSIS — M9901 Segmental and somatic dysfunction of cervical region: Secondary | ICD-10-CM | POA: Diagnosis not present

## 2020-01-30 DIAGNOSIS — M9902 Segmental and somatic dysfunction of thoracic region: Secondary | ICD-10-CM | POA: Diagnosis not present

## 2020-02-01 DIAGNOSIS — M5412 Radiculopathy, cervical region: Secondary | ICD-10-CM | POA: Diagnosis not present

## 2020-02-01 DIAGNOSIS — M62830 Muscle spasm of back: Secondary | ICD-10-CM | POA: Diagnosis not present

## 2020-02-01 DIAGNOSIS — M9901 Segmental and somatic dysfunction of cervical region: Secondary | ICD-10-CM | POA: Diagnosis not present

## 2020-02-01 DIAGNOSIS — M9902 Segmental and somatic dysfunction of thoracic region: Secondary | ICD-10-CM | POA: Diagnosis not present

## 2020-02-02 DIAGNOSIS — M62830 Muscle spasm of back: Secondary | ICD-10-CM | POA: Diagnosis not present

## 2020-02-02 DIAGNOSIS — M5412 Radiculopathy, cervical region: Secondary | ICD-10-CM | POA: Diagnosis not present

## 2020-02-02 DIAGNOSIS — M9902 Segmental and somatic dysfunction of thoracic region: Secondary | ICD-10-CM | POA: Diagnosis not present

## 2020-02-02 DIAGNOSIS — M9901 Segmental and somatic dysfunction of cervical region: Secondary | ICD-10-CM | POA: Diagnosis not present

## 2020-02-03 DIAGNOSIS — K921 Melena: Secondary | ICD-10-CM | POA: Diagnosis not present

## 2020-02-03 DIAGNOSIS — Z8371 Family history of colonic polyps: Secondary | ICD-10-CM | POA: Diagnosis not present

## 2020-02-03 DIAGNOSIS — Z86010 Personal history of colonic polyps: Secondary | ICD-10-CM | POA: Diagnosis not present

## 2020-02-03 DIAGNOSIS — K5909 Other constipation: Secondary | ICD-10-CM | POA: Diagnosis not present

## 2020-02-07 DIAGNOSIS — M9902 Segmental and somatic dysfunction of thoracic region: Secondary | ICD-10-CM | POA: Diagnosis not present

## 2020-02-07 DIAGNOSIS — M62830 Muscle spasm of back: Secondary | ICD-10-CM | POA: Diagnosis not present

## 2020-02-07 DIAGNOSIS — M9901 Segmental and somatic dysfunction of cervical region: Secondary | ICD-10-CM | POA: Diagnosis not present

## 2020-02-07 DIAGNOSIS — M5412 Radiculopathy, cervical region: Secondary | ICD-10-CM | POA: Diagnosis not present

## 2020-02-09 DIAGNOSIS — M5412 Radiculopathy, cervical region: Secondary | ICD-10-CM | POA: Diagnosis not present

## 2020-02-09 DIAGNOSIS — M62830 Muscle spasm of back: Secondary | ICD-10-CM | POA: Diagnosis not present

## 2020-02-09 DIAGNOSIS — M9902 Segmental and somatic dysfunction of thoracic region: Secondary | ICD-10-CM | POA: Diagnosis not present

## 2020-02-09 DIAGNOSIS — M9901 Segmental and somatic dysfunction of cervical region: Secondary | ICD-10-CM | POA: Diagnosis not present

## 2020-02-11 DIAGNOSIS — M9901 Segmental and somatic dysfunction of cervical region: Secondary | ICD-10-CM | POA: Diagnosis not present

## 2020-02-11 DIAGNOSIS — M9902 Segmental and somatic dysfunction of thoracic region: Secondary | ICD-10-CM | POA: Diagnosis not present

## 2020-02-11 DIAGNOSIS — M62830 Muscle spasm of back: Secondary | ICD-10-CM | POA: Diagnosis not present

## 2020-02-11 DIAGNOSIS — M5412 Radiculopathy, cervical region: Secondary | ICD-10-CM | POA: Diagnosis not present

## 2020-03-14 ENCOUNTER — Other Ambulatory Visit: Payer: Self-pay

## 2020-03-14 ENCOUNTER — Other Ambulatory Visit
Admission: RE | Admit: 2020-03-14 | Discharge: 2020-03-14 | Disposition: A | Discharge status: Home / Self Care | Payer: 59 | Source: Ambulatory Visit | Attending: General Surgery | Admitting: General Surgery

## 2020-03-14 DIAGNOSIS — Z20822 Contact with and (suspected) exposure to covid-19: Secondary | ICD-10-CM | POA: Insufficient documentation

## 2020-03-14 DIAGNOSIS — Z01812 Encounter for preprocedural laboratory examination: Secondary | ICD-10-CM | POA: Diagnosis not present

## 2020-03-14 LAB — SARS CORONAVIRUS 2 (TAT 6-24 HRS): SARS Coronavirus 2: NEGATIVE

## 2020-03-15 ENCOUNTER — Encounter: Payer: Self-pay | Admitting: General Surgery

## 2020-03-16 ENCOUNTER — Encounter: Payer: Self-pay | Admitting: *Deleted

## 2020-03-16 ENCOUNTER — Ambulatory Visit: Payer: 59 | Admitting: Anesthesiology

## 2020-03-16 ENCOUNTER — Ambulatory Visit
Admission: RE | Admit: 2020-03-16 | Discharge: 2020-03-16 | Disposition: A | Discharge status: Home / Self Care | Payer: 59 | Attending: General Surgery | Admitting: General Surgery

## 2020-03-16 ENCOUNTER — Encounter
Admission: RE | Disposition: A | Discharge status: Home / Self Care | Payer: Self-pay | Source: Home / Self Care | Attending: General Surgery

## 2020-03-16 ENCOUNTER — Other Ambulatory Visit: Payer: Self-pay

## 2020-03-16 DIAGNOSIS — Z8719 Personal history of other diseases of the digestive system: Secondary | ICD-10-CM | POA: Diagnosis not present

## 2020-03-16 DIAGNOSIS — Z86010 Personal history of colonic polyps: Secondary | ICD-10-CM | POA: Diagnosis not present

## 2020-03-16 DIAGNOSIS — K921 Melena: Secondary | ICD-10-CM | POA: Diagnosis not present

## 2020-03-16 DIAGNOSIS — K581 Irritable bowel syndrome with constipation: Secondary | ICD-10-CM | POA: Insufficient documentation

## 2020-03-16 DIAGNOSIS — D120 Benign neoplasm of cecum: Secondary | ICD-10-CM | POA: Insufficient documentation

## 2020-03-16 DIAGNOSIS — Z79899 Other long term (current) drug therapy: Secondary | ICD-10-CM | POA: Insufficient documentation

## 2020-03-16 DIAGNOSIS — K635 Polyp of colon: Secondary | ICD-10-CM | POA: Diagnosis not present

## 2020-03-16 DIAGNOSIS — K317 Polyp of stomach and duodenum: Secondary | ICD-10-CM | POA: Insufficient documentation

## 2020-03-16 DIAGNOSIS — K5900 Constipation, unspecified: Secondary | ICD-10-CM | POA: Diagnosis not present

## 2020-03-16 DIAGNOSIS — Z1211 Encounter for screening for malignant neoplasm of colon: Secondary | ICD-10-CM | POA: Diagnosis not present

## 2020-03-16 HISTORY — DX: Leiomyoma of uterus, unspecified: D25.9

## 2020-03-16 HISTORY — DX: Polyp of colon: K63.5

## 2020-03-16 HISTORY — PX: COLONOSCOPY WITH PROPOFOL: SHX5780

## 2020-03-16 HISTORY — DX: Irritable bowel syndrome, unspecified: K58.9

## 2020-03-16 HISTORY — DX: Personal history of cervical dysplasia: Z87.410

## 2020-03-16 HISTORY — DX: Allergy status to unspecified drugs, medicaments and biological substances: Z88.9

## 2020-03-16 HISTORY — DX: Other constipation: K59.09

## 2020-03-16 HISTORY — PX: ESOPHAGOGASTRODUODENOSCOPY (EGD) WITH PROPOFOL: SHX5813

## 2020-03-16 LAB — POCT PREGNANCY, URINE: Preg Test, Ur: NEGATIVE

## 2020-03-16 SURGERY — ESOPHAGOGASTRODUODENOSCOPY (EGD) WITH PROPOFOL
Anesthesia: General

## 2020-03-16 MED ORDER — LIDOCAINE HCL (CARDIAC) PF 100 MG/5ML IV SOSY
PREFILLED_SYRINGE | INTRAVENOUS | Status: DC | PRN
  Administered 2020-03-16: 50 mg via INTRAVENOUS

## 2020-03-16 MED ORDER — FENTANYL CITRATE (PF) 100 MCG/2ML IJ SOLN
INTRAMUSCULAR | Status: AC
  Filled 2020-03-16: qty 2

## 2020-03-16 MED ORDER — PROPOFOL 500 MG/50ML IV EMUL
INTRAVENOUS | Status: AC
  Filled 2020-03-16: qty 50

## 2020-03-16 MED ORDER — FENTANYL CITRATE (PF) 100 MCG/2ML IJ SOLN
INTRAMUSCULAR | Status: DC | PRN
  Administered 2020-03-16: 50 ug via INTRAVENOUS

## 2020-03-16 MED ORDER — PROPOFOL 10 MG/ML IV BOLUS
INTRAVENOUS | Status: DC | PRN
  Administered 2020-03-16: 50 mg via INTRAVENOUS
  Administered 2020-03-16 (×3): 20 mg via INTRAVENOUS

## 2020-03-16 MED ORDER — PHENYLEPHRINE HCL (PRESSORS) 10 MG/ML IV SOLN
INTRAVENOUS | Status: DC | PRN
  Administered 2020-03-16: 100 ug via INTRAVENOUS
  Administered 2020-03-16: 150 ug via INTRAVENOUS
  Administered 2020-03-16: 50 ug via INTRAVENOUS
  Administered 2020-03-16 (×2): 100 ug via INTRAVENOUS

## 2020-03-16 MED ORDER — LIDOCAINE HCL (PF) 2 % IJ SOLN
INTRAMUSCULAR | Status: AC
  Filled 2020-03-16: qty 5

## 2020-03-16 MED ORDER — SODIUM CHLORIDE 0.9 % IV SOLN
INTRAVENOUS | Status: DC
  Administered 2020-03-16: 07:00:00 1000 mL via INTRAVENOUS

## 2020-03-16 MED ORDER — GLYCOPYRROLATE 0.2 MG/ML IJ SOLN
INTRAMUSCULAR | Status: AC
  Filled 2020-03-16: qty 1

## 2020-03-16 MED ORDER — PROPOFOL 500 MG/50ML IV EMUL
INTRAVENOUS | Status: DC | PRN
  Administered 2020-03-16: 100 ug/kg/min via INTRAVENOUS

## 2020-03-16 NOTE — Transfer of Care (Signed)
Immediate Anesthesia Transfer of Care Note  Patient: Karina [name]  Procedure(s) Performed: ESOPHAGOGASTRODUODENOSCOPY (EGD) WITH PROPOFOL (N/A ) COLONOSCOPY WITH PROPOFOL (N/A )  Patient Location: PACU  Anesthesia Type:General  Level of Consciousness: awake, alert  and oriented  Airway & Oxygen Therapy: Patient Spontanous Breathing and Patient connected to nasal cannula oxygen  Post-op Assessment: Report given to RN and Post -op Vital signs reviewed and stable  Post vital signs: Reviewed and stable  Last Vitals:  Vitals Value Taken Time  BP 94/47 03/16/20  0845  Temp 36.1 C 03/16/20 0847  Pulse 97 03/16/20 0847  Resp 18 03/16/20 0847  SpO2 100 % 03/16/20 0847    Last Pain:  Vitals:   03/16/20 0847  TempSrc: Tympanic  PainSc:          Complications: No apparent anesthesia complications

## 2020-03-16 NOTE — Op Note (Addendum)
[facility] Gastroenterology Patient Name: Karina [name] Procedure Date: 03/16/2020 7:58 AM MRN: [id number] Account #: [id number] Date of Birth: 1971/08/22 Admit Type: Outpatient Age: 49 Room: [facility] ENDO ROOM 1 Gender: Female Note Status: Supervisor Override Procedure:             Colonoscopy Indications:           Personal history of colonic polyps, Constipation Providers:             Robert Bellow, MD Medicines:             Monitored Anesthesia Care Complications:         No immediate complications. Procedure:             Pre-Anesthesia Assessment:                        - Prior to the procedure, a History and Physical was                         performed, and patient medications, allergies and                         sensitivities were reviewed. The patient's tolerance                         of previous anesthesia was reviewed.                        - The risks and benefits of the procedure and the                         sedation options and risks were discussed with the                         patient. All questions were answered and informed                         consent was obtained.                        After obtaining informed consent, the colonoscope was                         passed under direct vision. Throughout the procedure,                         the patient's blood pressure, pulse, and oxygen                         saturations were monitored continuously. The                         Colonoscope was introduced through the anus and                         advanced to the the cecum, identified by appendiceal                         orifice and ileocecal valve. The colonoscopy was  performed without difficulty. The patient tolerated                         the procedure well. The quality of the bowel                         preparation was excellent. Findings:      A 6 mm polyp was found in the cecum. The polyp was  sessile. Biopsies       were taken with a cold forceps for histology.      The retroflexed view of the distal rectum and anal verge was normal and       showed no anal or rectal abnormalities. Impression:            - One 6 mm polyp in the cecum. Biopsied.                        - The distal rectum and anal verge are normal on                         retroflexion view. Recommendation:        - Telephone endoscopist for pathology results in 1                         week. Procedure Code(s):     --- Professional ---                        [phone], Colonoscopy, flexible; with biopsy, single or                         multiple Diagnosis Code(s):     --- Professional ---                        Z12.11, Encounter for screening for malignant neoplasm                         of colon                        K63.5, Polyp of colon CPT copyright 2019 American Medical Association. All rights reserved. The codes documented in this report are preliminary and upon coder review may  be revised to meet current compliance requirements. Robert Bellow, MD 03/16/2020 8:41:26 AM This report has been signed electronically. Number of Addenda: 0 Note Initiated On: 03/16/2020 7:58 AM Scope Withdrawal Time: 0 hours 11 minutes 3 seconds  Total Procedure Duration: 0 hours 18 minutes 1 second       [facility]

## 2020-03-16 NOTE — Anesthesia Postprocedure Evaluation (Signed)
Anesthesia Post Note  Patient: Karina [name]  Procedure(s) Performed: ESOPHAGOGASTRODUODENOSCOPY (EGD) WITH PROPOFOL (N/A ) COLONOSCOPY WITH PROPOFOL (N/A )  Patient location during evaluation: PACU Anesthesia Type: General Level of consciousness: awake and alert Pain management: pain level controlled Vital Signs Assessment: post-procedure vital signs reviewed and stable Respiratory status: spontaneous breathing, nonlabored ventilation and respiratory function stable Cardiovascular status: blood pressure returned to baseline and stable Postop Assessment: no apparent nausea or vomiting Anesthetic complications: no     Last Vitals:  Vitals:   03/16/20 0910 03/16/20 0913  BP:  101/66  Pulse: 86   Resp: 13   Temp:    SpO2: 100%     Last Pain:  Vitals:   03/16/20 0847  TempSrc: Tympanic  PainSc:                  Tera Mater

## 2020-03-16 NOTE — H&P (Signed)
Karina [name] [id number] Dec 04, 1971     HPI: 49 year old woman with a near lifelong history of constipation.  May move her bowels 1-2 times per week.  No improvement with as needed use of MiraLAX or suppositories or laxatives.  As a child, severe cramping pain.  Recently had an episode of black stools that lasted for about 2 weeks.  She described them as sticking to the side of the toilet bowl.  Normally, hard, scybala-like stools.  CBC shows no significant hemoglobin change.  Normal MCV.  For upper and lower endoscopy.  Medications Prior to Admission  Medication Sig Dispense Refill Last Dose  . cephALEXin (KEFLEX) 500 MG capsule Take 1 capsule (500 mg total) by mouth 3 (three) times daily. 21 capsule 3 Past Week at Unknown time  . Cholecalciferol (VITAMIN D3 PO) Take by mouth daily.   Past Week at Unknown time  . fluconazole (DIFLUCAN) 100 MG tablet Take 1 tablet (100 mg total) by mouth every other day. X 7 days 3 tablet 3 Past Week at Unknown time  . ketoconazole (NIZORAL) 2 % shampoo Apply 1 application topically as needed for irritation.   Past Week at Unknown time  . Meth-Hyo-M Bl-Na Phos-Ph Sal (URIBEL) 118 MG CAPS Take 1 capsule (118 mg total) by mouth 3 (three) times daily as needed (Urinary frequency, urgency, burning). 90 capsule 0 Past Week at Unknown time  . mirabegron ER (MYRBETRIQ) 50 MG TB24 tablet Take 1 tablet (50 mg total) by mouth daily. 30 tablet 3 Past Week at Unknown time  . oxybutynin (DITROPAN-XL) 10 MG 24 hr tablet Take 10 mg by mouth at bedtime.   Past Week at Unknown time  . Pediatric Multivit-Minerals-C (CHEWABLES MULTIVITAMIN PO) Take by mouth.   Past Week at Unknown time  . Polyethyl Glycol-Propyl Glycol (SYSTANE OP) Apply to eye.   Past Week at Unknown time  . XIIDRA 5 % SOLN    Past Week at Unknown time   Allergies  Allergen Reactions  . Macrobid [Nitrofurantoin Macrocrystal] Nausea And Vomiting and Rash  . Tdap [Tetanus-Diphth-Acell Pertussis] Swelling and  Rash   Past Medical History:  Diagnosis Date  . Allergic genetic state   . Chronic constipation   . Colon polyp   . Complication of anesthesia    facial swelling after 1 bladder procedure  . Fibroid uterus   . History of cervical dysplasia   . IBS (irritable bowel syndrome)   . Interstitial cystitis   . Psoriasis   . Psoriatic arthritis [facility])    Past Surgical History:  Procedure Laterality Date  . bladder hydrodistentions    . BLADDER SURGERY    . CATARACT EXTRACTION W/PHACO Right 08/03/2019   Procedure: CATARACT EXTRACTION PHACO AND INTRAOCULAR LENS PLACEMENT ([place])  RIGHT TORIC LENS  00:21.4  13.7%  2.94;  Surgeon: Leandrew Koyanagi, MD;  Location: [place];  Service: Ophthalmology;  Laterality: Right;  . CATARACT EXTRACTION W/PHACO Left 08/24/2019   Procedure: CATARACT EXTRACTION PHACO AND INTRAOCULAR LENS PLACEMENT ([place]) LEFT TORIC LENS  0:20 13.6% 2.82;  Surgeon: Leandrew Koyanagi, MD;  Location: [place];  Service: Ophthalmology;  Laterality: Left;  . CHOLECYSTECTOMY    . COLONOSCOPY    . ESOPHAGOGASTRODUODENOSCOPY    . FLEXIBLE SIGMOIDOSCOPY    . LIPOSUCTION TRUNK    . TONSILLECTOMY     Social History   Socioeconomic History  . Marital status: Married    Spouse name: Not on file  . Number of children: Not on file  .  Years of education: Not on file  . Highest education level: Not on file  Occupational History  . Not on file  Tobacco Use  . Smoking status: Never Smoker  . Smokeless tobacco: Never Used  Substance and Sexual Activity  . Alcohol use: No  . Drug use: No  . Sexual activity: Yes    Birth control/protection: None  Other Topics Concern  . Not on file  Social History Narrative  . Not on file   Social Determinants of Health   Financial Resource Strain:   . Difficulty of Paying Living Expenses:   Food Insecurity:   . Worried About Charity fundraiser in the Last Year:   . Arboriculturist in the Last Year:    Transportation Needs:   . Film/video editor (Medical):   Marland Kitchen Lack of Transportation (Non-Medical):   Physical Activity:   . Days of Exercise per Week:   . Minutes of Exercise per Session:   Stress:   . Feeling of Stress :   Social Connections:   . Frequency of Communication with Friends and Family:   . Frequency of Social Gatherings with Friends and Family:   . Attends Religious Services:   . Active Member of Clubs or Organizations:   . Attends Archivist Meetings:   Marland Kitchen Marital Status:   Intimate Partner Violence:   . Fear of Current or Ex-Partner:   . Emotionally Abused:   Marland Kitchen Physically Abused:   . Sexually Abused:    Social History   Social History Narrative  . Not on file     ROS: Negative.     PE: HEENT: Negative. Lungs: Clear. Cardio: RR.    Assessment/Plan:  Proceed with planned endoscopy.   Forest Gleason [facility] 03/16/2020

## 2020-03-16 NOTE — Anesthesia Preprocedure Evaluation (Addendum)
Anesthesia Evaluation  Patient identified by MRN, date of birth, ID band Patient awake    Reviewed: Allergy & Precautions, H&P , NPO status , Patient's Chart, lab work & pertinent test results  History of Anesthesia Complications (+) history of anesthetic complications ("swelling after bladder procedure." Other anesthetics no problem)  Airway Mallampati: II  TM Distance: >3 FB Neck ROM: full    Dental  (+) Teeth Intact   Pulmonary neg pulmonary ROS, neg COPD,    breath sounds clear to auscultation       Cardiovascular (-) angina(-) Past MI negative cardio ROS  (-) dysrhythmias  Rhythm:regular Rate:Normal     Neuro/Psych  Headaches, negative psych ROS   GI/Hepatic Neg liver ROS, GERD  ,  Endo/Other  negative endocrine ROS  Renal/GU negative Renal ROS  negative genitourinary   Musculoskeletal   Abdominal   Peds  Hematology negative hematology ROS (+)   Anesthesia Other Findings Past Medical History: No date: Allergic genetic state No date: Chronic constipation No date: Colon polyp No date: Complication of anesthesia     Comment:  facial swelling after 1 bladder procedure No date: Fibroid uterus No date: History of cervical dysplasia No date: IBS (irritable bowel syndrome) No date: Interstitial cystitis No date: Psoriasis No date: Psoriatic arthritis ([place])  Past Surgical History: No date: bladder hydrodistentions No date: BLADDER SURGERY 08/03/2019: CATARACT EXTRACTION W/PHACO; Right     Comment:  Procedure: CATARACT EXTRACTION PHACO AND INTRAOCULAR               LENS PLACEMENT ([place])  RIGHT TORIC LENS  00:21.4  13.7%                2.94;  Surgeon: Leandrew Koyanagi, MD;  Location:               [place];  Service: Ophthalmology;                Laterality: Right; 08/24/2019: CATARACT EXTRACTION W/PHACO; Left     Comment:  Procedure: CATARACT EXTRACTION PHACO AND INTRAOCULAR               LENS  PLACEMENT ([place]) LEFT TORIC LENS  0:20 13.6% 2.82;                Surgeon: Leandrew Koyanagi, MD;  Location: [place];  Service: Ophthalmology;  Laterality: Left; No date: CHOLECYSTECTOMY No date: COLONOSCOPY No date: ESOPHAGOGASTRODUODENOSCOPY No date: FLEXIBLE SIGMOIDOSCOPY No date: LIPOSUCTION TRUNK No date: TONSILLECTOMY     Reproductive/Obstetrics negative OB ROS                            Anesthesia Physical Anesthesia Plan  ASA: II  Anesthesia Plan: General   Post-op Pain Management:    Induction:   PONV Risk Score and Plan: Propofol infusion and TIVA  Airway Management Planned: Natural Airway and Nasal Cannula  Additional Equipment:   Intra-op Plan:   Post-operative Plan:   Informed Consent: I have reviewed the patients History and Physical, chart, labs and discussed the procedure including the risks, benefits and alternatives for the proposed anesthesia with the patient or authorized representative who has indicated his/her understanding and acceptance.     Dental Advisory Given  Plan Discussed with: Anesthesiologist  Anesthesia Plan Comments:         Anesthesia Quick Evaluation

## 2020-03-16 NOTE — Op Note (Signed)
[facility] Gastroenterology Patient Name: Karina [name] Procedure Date: 03/16/2020 7:54 AM MRN: [id number] Account #: [id number] Date of Birth: [birth year] Admit Type: Outpatient Age: 49 Room: [facility] ENDO ROOM 1 Gender: Female Note Status: Finalized Procedure:             Upper GI endoscopy Indications:           Melena Providers:             Robert Bellow, MD Medicines:             Monitored Anesthesia Care Complications:         No immediate complications. Procedure:             Pre-Anesthesia Assessment:                        - Prior to the procedure, a History and Physical was                         performed, and patient medications, allergies and                         sensitivities were reviewed. The patient's tolerance                         of previous anesthesia was reviewed.                        - The risks and benefits of the procedure and the                         sedation options and risks were discussed with the                         patient. All questions were answered and informed                         consent was obtained.                        After obtaining informed consent, the endoscope was                         passed under direct vision. Throughout the procedure,                         the patient's blood pressure, pulse, and oxygen                         saturations were monitored continuously. The Endoscope                         was introduced through the mouth, and advanced to the                         second part of duodenum. The upper GI endoscopy was                         accomplished without difficulty. The patient tolerated  the procedure well. Findings:      The esophagus was normal.      The examined duodenum was normal.      Multiple 5 mm sessile polyps with no bleeding and no stigmata of recent       bleeding were found in the gastric fundus. Biopsies were taken with a       cold  forceps for histology. Impression:            - Normal esophagus.                        - Normal examined duodenum.                        - Multiple gastric polyps. Biopsied. Recommendation:        - Perform a colonoscopy today. Procedure Code(s):     --- Professional ---                        [phone], Esophagogastroduodenoscopy, flexible,                         transoral; with biopsy, single or multiple Diagnosis Code(s):     --- Professional ---                        K31.7, Polyp of stomach and duodenum                        K92.1, Melena (includes Hematochezia) CPT copyright 2019 American Medical Association. All rights reserved. The codes documented in this report are preliminary and upon coder review may  be revised to meet current compliance requirements. Robert Bellow, MD 03/16/2020 8:16:14 AM This report has been signed electronically. Number of Addenda: 0 Note Initiated On: 03/16/2020 7:54 AM      [facility]

## 2020-03-19 LAB — SURGICAL PATHOLOGY

## 2020-03-27 DIAGNOSIS — Z79899 Other long term (current) drug therapy: Secondary | ICD-10-CM | POA: Diagnosis not present

## 2020-03-27 DIAGNOSIS — N3010 Interstitial cystitis (chronic) without hematuria: Secondary | ICD-10-CM | POA: Diagnosis not present

## 2020-03-27 DIAGNOSIS — Z0000 Encounter for general adult medical examination without abnormal findings: Secondary | ICD-10-CM | POA: Diagnosis not present

## 2020-03-27 DIAGNOSIS — L4050 Arthropathic psoriasis, unspecified: Secondary | ICD-10-CM | POA: Diagnosis not present

## 2020-03-27 DIAGNOSIS — E559 Vitamin D deficiency, unspecified: Secondary | ICD-10-CM | POA: Diagnosis not present

## 2020-03-27 DIAGNOSIS — Z13220 Encounter for screening for lipoid disorders: Secondary | ICD-10-CM | POA: Diagnosis not present

## 2020-04-03 DIAGNOSIS — H04123 Dry eye syndrome of bilateral lacrimal glands: Secondary | ICD-10-CM | POA: Diagnosis not present

## 2020-05-25 ENCOUNTER — Encounter: Payer: Self-pay | Admitting: Urology

## 2020-05-25 ENCOUNTER — Other Ambulatory Visit: Payer: Self-pay

## 2020-05-25 ENCOUNTER — Ambulatory Visit (INDEPENDENT_AMBULATORY_CARE_PROVIDER_SITE_OTHER): Payer: 59 | Admitting: Urology

## 2020-05-25 VITALS — BP 130/77 | HR 106 | Ht 63.0 in | Wt 170.0 lb

## 2020-05-25 DIAGNOSIS — N390 Urinary tract infection, site not specified: Secondary | ICD-10-CM | POA: Diagnosis not present

## 2020-05-25 DIAGNOSIS — N3010 Interstitial cystitis (chronic) without hematuria: Secondary | ICD-10-CM

## 2020-05-25 DIAGNOSIS — N393 Stress incontinence (female) (male): Secondary | ICD-10-CM

## 2020-05-25 MED ORDER — MIRABEGRON ER 50 MG PO TB24: 50.0000 mg | ORAL_TABLET | Freq: Every day | ORAL | 6 refills | Status: DC

## 2020-05-25 MED ORDER — URIBEL 118 MG PO CAPS: 1.0000 | ORAL_CAPSULE | Freq: Three times a day (TID) | ORAL | 3 refills | Status: DC | PRN

## 2020-05-25 MED ORDER — FLUCONAZOLE 100 MG PO TABS: 100.0000 mg | ORAL_TABLET | ORAL | 3 refills | Status: DC

## 2020-05-25 MED ORDER — CEPHALEXIN 500 MG PO CAPS: 500.0000 mg | ORAL_CAPSULE | Freq: Three times a day (TID) | ORAL | 3 refills | Status: DC

## 2020-05-25 NOTE — Progress Notes (Signed)
05/25/2020 3:25 PM   [name] 1971-05-07 [id number]  Referring provider: No referring provider defined for this encounter.  Chief Complaint  Patient presents with  . Cystitis    Urologic history: 1.  Interstitial cystitis -Diagnosed by Dr. Jacqlyn Larsen -managed with Lynnda Shields and Myrbetriq as needed  2.  Recurrent UTI -Demand therapy with cephalexin -Yeast vaginitis with antibiotic therapy  3.  Stress urinary incontinence   HPI: 49 yo female presents for follow-up.  Her insurance deductible was recently satisfied and she wanted to be seen earlier before her new coverage.  [place]  -Since her last visit October 2020 she has only required demand therapy with cephalexin once -Stress incontinence slightly worse -Denies dysuria, gross hematuria -No flank, abdominal, pelvic pain   PMH: Past Medical History:  Diagnosis Date  . Allergic genetic state   . Chronic constipation   . Colon polyp   . Complication of anesthesia    facial swelling after 1 bladder procedure  . Fibroid uterus   . History of cervical dysplasia   . IBS (irritable bowel syndrome)   . Interstitial cystitis   . Psoriasis   . Psoriatic arthritis [facility])     Surgical History: Past Surgical History:  Procedure Laterality Date  . bladder hydrodistentions    . BLADDER SURGERY    . CATARACT EXTRACTION W/PHACO Right 08/03/2019   Procedure: CATARACT EXTRACTION PHACO AND INTRAOCULAR LENS PLACEMENT ([place])  RIGHT TORIC LENS  00:21.4  13.7%  2.94;  Surgeon: Leandrew Koyanagi, MD;  Location: [place];  Service: Ophthalmology;  Laterality: Right;  . CATARACT EXTRACTION W/PHACO Left 08/24/2019   Procedure: CATARACT EXTRACTION PHACO AND INTRAOCULAR LENS PLACEMENT ([place]) LEFT TORIC LENS  0:20 13.6% 2.82;  Surgeon: Leandrew Koyanagi, MD;  Location: [place];  Service: Ophthalmology;  Laterality: Left;  . CHOLECYSTECTOMY    . COLONOSCOPY    . COLONOSCOPY WITH PROPOFOL N/A 03/16/2020    Procedure: COLONOSCOPY WITH PROPOFOL;  Surgeon: Robert Bellow, MD;  Location: [facility] ENDOSCOPY;  Service: Endoscopy;  Laterality: N/A;  . ESOPHAGOGASTRODUODENOSCOPY    . ESOPHAGOGASTRODUODENOSCOPY (EGD) WITH PROPOFOL N/A 03/16/2020   Procedure: ESOPHAGOGASTRODUODENOSCOPY (EGD) WITH PROPOFOL;  Surgeon: Robert Bellow, MD;  Location: [facility] ENDOSCOPY;  Service: Endoscopy;  Laterality: N/A;  . FLEXIBLE SIGMOIDOSCOPY    . LIPOSUCTION TRUNK    . TONSILLECTOMY      Home Medications:  Allergies as of 05/25/2020      Reactions   Macrobid [nitrofurantoin Macrocrystal] Nausea And Vomiting, Rash   Tdap [tetanus-diphth-acell Pertussis] Swelling, Rash      Medication List       Accurate as of May 25, 2020  3:25 PM. If you have any questions, ask your nurse or doctor.        STOP taking these medications   ketoconazole 2 % shampoo Commonly known as: NIZORAL Stopped by: Abbie Sons, MD     TAKE these medications   cephALEXin 500 MG capsule Commonly known as: Keflex Take 1 capsule (500 mg total) by mouth 3 (three) times daily.   CHEWABLES MULTIVITAMIN PO Take by mouth.   fluconazole 100 MG tablet Commonly known as: DIFLUCAN Take 1 tablet (100 mg total) by mouth every other day. X 7 days   mirabegron ER 50 MG Tb24 tablet Commonly known as: MYRBETRIQ Take 1 tablet (50 mg total) by mouth daily.   oxybutynin 10 MG 24 hr tablet Commonly known as: DITROPAN-XL Take 10 mg by mouth at bedtime.   SYSTANE OP  Apply to eye.   Uribel 118 MG Caps Take 1 capsule (118 mg total) by mouth 3 (three) times daily as needed (Urinary frequency, urgency, burning).   VITAMIN D3 PO Take by mouth daily.   Xiidra 5 % Soln Generic drug: Lifitegrast       Allergies:  Allergies  Allergen Reactions  . Macrobid [Nitrofurantoin Macrocrystal] Nausea And Vomiting and Rash  . Tdap [Tetanus-Diphth-Acell Pertussis] Swelling and Rash    Family History: Family History  Problem Relation Age of  Onset  . Ulcers Mother   . Hypertension Father   . Prostate cancer Father   . Colon polyps Father   . Hypertension Brother   . Colon polyps Brother   . Clotting disorder Brother   . Diabetes Paternal Grandfather   . Stroke Maternal Grandmother   . Migraines Neg Hx     Social History:  reports that she has never smoked. She has never used smokeless tobacco. She reports that she does not drink alcohol and does not use drugs.   Physical Exam: BP 130/77   Pulse (!) 106   Ht 5\' 3"  (1.6 m)   Wt 170 lb (77.1 kg)   BMI 30.11 kg/m   Constitutional:  Alert and oriented, No acute distress. HEENT: [place] AT, moist mucus membranes.  Trachea midline, no masses. Cardiovascular: No clubbing, cyanosis, or edema. Respiratory: Normal respiratory effort, no increased work of breathing. Skin: No rashes, bruises or suspicious lesions. Neurologic: Grossly intact, no focal deficits, moving all 4 extremities. Psychiatric: Normal mood and affect.   Assessment & Plan:    Interstitial cystitis -Myrbetriq and Uribel refilled  Recurrent UTI Cephalexin and fluconazole refilled  Stress urinary incontinence -Symptoms slightly worse.  We discussed pelvic floor physical therapy and she was interested in pursuing.  This may also improve her storage related voiding symptoms.   Abbie Sons, [place] [address], [place], [place] [address] [phone]

## 2020-05-27 ENCOUNTER — Encounter: Payer: Self-pay | Admitting: Urology

## 2020-05-27 DIAGNOSIS — N390 Urinary tract infection, site not specified: Secondary | ICD-10-CM | POA: Insufficient documentation

## 2020-05-29 LAB — MICROSCOPIC EXAMINATION

## 2020-05-29 LAB — URINALYSIS, COMPLETE
Bilirubin, UA: NEGATIVE
Glucose, UA: NEGATIVE
Ketones, UA: NEGATIVE
Leukocytes,UA: NEGATIVE
Nitrite, UA: NEGATIVE
Protein,UA: NEGATIVE
RBC, UA: NEGATIVE
Specific Gravity, UA: >1.03 — ABNORMAL HIGH (ref 1.005–1.030)
Urobilinogen, Ur: 0.2 mg/dL (ref 0.2–1.0)
pH, UA: 6 (ref 5.0–7.5)

## 2020-09-24 ENCOUNTER — Ambulatory Visit: Payer: 59 | Admitting: Urology

## 2020-11-06 ENCOUNTER — Other Ambulatory Visit: Payer: 59

## 2020-11-06 DIAGNOSIS — Z1152 Encounter for screening for COVID-19: Secondary | ICD-10-CM

## 2020-11-06 LAB — POCT INFLUENZA A/B
Influenza A, POC: NEGATIVE
Influenza B, POC: NEGATIVE

## 2020-11-06 NOTE — Progress Notes (Signed)
Pt presents today with covid/flu like symptoms. Symptoms are cough, body aches, fever, and  deep congestion per pt. Pt was swabbed for flu and covid.

## 2020-11-08 ENCOUNTER — Ambulatory Visit: Payer: 59 | Admitting: Urology

## 2020-11-08 LAB — SARS-COV-2, NAA 2 DAY TAT

## 2020-11-08 LAB — NOVEL CORONAVIRUS, NAA: SARS-CoV-2, NAA: NOT DETECTED

## 2020-11-15 ENCOUNTER — Ambulatory Visit: Payer: 59 | Admitting: Urology

## 2020-11-27 ENCOUNTER — Other Ambulatory Visit: Payer: Self-pay

## 2020-11-27 ENCOUNTER — Ambulatory Visit: Payer: Self-pay

## 2020-11-27 DIAGNOSIS — Z23 Encounter for immunization: Secondary | ICD-10-CM

## 2020-12-05 ENCOUNTER — Encounter: Payer: Self-pay | Admitting: Urology

## 2020-12-13 ENCOUNTER — Ambulatory Visit: Payer: 59 | Admitting: Urology

## 2021-02-11 ENCOUNTER — Encounter: Payer: Self-pay | Admitting: Urology

## 2021-02-11 ENCOUNTER — Other Ambulatory Visit: Payer: Self-pay

## 2021-02-11 ENCOUNTER — Ambulatory Visit (INDEPENDENT_AMBULATORY_CARE_PROVIDER_SITE_OTHER): Payer: 59 | Admitting: Urology

## 2021-02-11 VITALS — BP 109/77 | HR 96 | Ht 64.0 in | Wt 160.0 lb

## 2021-02-11 DIAGNOSIS — N3010 Interstitial cystitis (chronic) without hematuria: Secondary | ICD-10-CM

## 2021-02-11 DIAGNOSIS — N390 Urinary tract infection, site not specified: Secondary | ICD-10-CM | POA: Diagnosis not present

## 2021-02-11 DIAGNOSIS — N393 Stress incontinence (female) (male): Secondary | ICD-10-CM

## 2021-02-11 MED ORDER — CEPHALEXIN 500 MG PO CAPS: 500.0000 mg | ORAL_CAPSULE | Freq: Three times a day (TID) | ORAL | 3 refills | Status: DC

## 2021-02-11 MED ORDER — FLUCONAZOLE 100 MG PO TABS: 100.0000 mg | ORAL_TABLET | ORAL | 3 refills | Status: DC

## 2021-02-11 MED ORDER — URIBEL 118 MG PO CAPS: 1.0000 | ORAL_CAPSULE | Freq: Three times a day (TID) | ORAL | 3 refills | Status: DC | PRN

## 2021-02-11 NOTE — Progress Notes (Signed)
02/11/2021 3:31 PM   Karina [name] March 08, 1971 [id number]  Referring provider: No referring provider defined for this encounter.  Chief Complaint  Patient presents with  . Other    Urologic history: 1.  Interstitial cystitis -Diagnosed by Dr. Jacqlyn Larsen -managed with Lynnda Shields and Myrbetriq as needed  2.  Recurrent UTI -Demand therapy with cephalexin -Yeast vaginitis with antibiotic therapy  3.  Stress urinary incontinence   HPI: 50 y.o. female presents for follow-up.   Worsening symptoms over the last 2 weeks including suprapubic pain, low back pain and pain under her shoulder blades  Has noted increased urinary frequency and urgency  No dysuria or gross hematuria  Presently not taking Myrbetriq since she is working at home and frequency and urgency is not a problem   PMH: Past Medical History:  Diagnosis Date  . Allergic genetic state   . Chronic constipation   . Colon polyp   . Complication of anesthesia    facial swelling after 1 bladder procedure  . Fibroid uterus   . History of cervical dysplasia   . IBS (irritable bowel syndrome)   . Interstitial cystitis   . Psoriasis   . Psoriatic arthritis [facility])     Surgical History: Past Surgical History:  Procedure Laterality Date  . bladder hydrodistentions    . BLADDER SURGERY    . CATARACT EXTRACTION W/PHACO Right 08/03/2019   Procedure: CATARACT EXTRACTION PHACO AND INTRAOCULAR LENS PLACEMENT ([place])  RIGHT TORIC LENS  00:21.4  13.7%  2.94;  Surgeon: Leandrew Koyanagi, MD;  Location: [place];  Service: Ophthalmology;  Laterality: Right;  . CATARACT EXTRACTION W/PHACO Left 08/24/2019   Procedure: CATARACT EXTRACTION PHACO AND INTRAOCULAR LENS PLACEMENT ([place]) LEFT TORIC LENS  0:20 13.6% 2.82;  Surgeon: Leandrew Koyanagi, MD;  Location: [place];  Service: Ophthalmology;  Laterality: Left;  . CHOLECYSTECTOMY    . COLONOSCOPY    . COLONOSCOPY WITH PROPOFOL N/A 03/16/2020   Procedure:  COLONOSCOPY WITH PROPOFOL;  Surgeon: Robert Bellow, MD;  Location: [facility];  Service: Endoscopy;  Laterality: N/A;  . ESOPHAGOGASTRODUODENOSCOPY    . ESOPHAGOGASTRODUODENOSCOPY (EGD) WITH PROPOFOL N/A 03/16/2020   Procedure: ESOPHAGOGASTRODUODENOSCOPY (EGD) WITH PROPOFOL;  Surgeon: Robert Bellow, MD;  Location: [facility];  Service: Endoscopy;  Laterality: N/A;  . FLEXIBLE SIGMOIDOSCOPY    . LIPOSUCTION TRUNK    . TONSILLECTOMY      Home Medications:  Allergies as of 02/11/2021      Reactions   Macrobid [nitrofurantoin Macrocrystal] Nausea And Vomiting, Rash   Tdap [tetanus-diphth-acell Pertussis] Swelling, Rash      Medication List       Accurate as of February 11, 2021  3:31 PM. If you have any questions, ask your nurse or doctor.        STOP taking these medications   cephALEXin 500 MG capsule Commonly known as: Keflex Stopped by: Abbie Sons, MD     TAKE these medications   CHEWABLES MULTIVITAMIN PO Take by mouth.   fluconazole 100 MG tablet Commonly known as: DIFLUCAN Take 1 tablet (100 mg total) by mouth every other day. X 7 days   mirabegron ER 50 MG Tb24 tablet Commonly known as: MYRBETRIQ Take 1 tablet (50 mg total) by mouth daily.   SYSTANE OP Apply to eye.   Uribel 118 MG Caps Take 1 capsule (118 mg total) by mouth 3 (three) times daily as needed (Urinary frequency, urgency, burning).   Xiidra 5 % Soln Generic drug: Lifitegrast  Allergies:  Allergies  Allergen Reactions  . Macrobid [Nitrofurantoin Macrocrystal] Nausea And Vomiting and Rash  . Tdap [Tetanus-Diphth-Acell Pertussis] Swelling and Rash    Family History: Family History  Problem Relation Age of Onset  . Ulcers Mother   . Hypertension Father   . Prostate cancer Father   . Colon polyps Father   . Hypertension Brother   . Colon polyps Brother   . Clotting disorder Brother   . Diabetes Paternal Grandfather   . Stroke Maternal Grandmother   . Migraines Neg  Hx     Social History:  reports that she has never smoked. She has never used smokeless tobacco. She reports that she does not drink alcohol and does not use drugs.   Physical Exam: There were no vitals taken for this visit.  Constitutional:  Alert and oriented, No acute distress. HEENT: [place] AT, moist mucus membranes.  Trachea midline, no masses. Cardiovascular: No clubbing, cyanosis, or edema. Respiratory: Normal respiratory effort, no increased work of breathing. Psychiatric: Normal mood and affect.  Laboratory Data:  Urinalysis Dipstick negative Microscopy negative  Assessment & Plan:    1.  Chronic interstitial cystitis  Uribel refilled  2.  Recurrent UTI  Cephalexin and fluconazole refill  3.  Stress urinary incontinence  Stable   Abbie Sons, MD  [facility] [address] [place], [place] [address] [phone]

## 2021-02-14 LAB — URINALYSIS, COMPLETE
Bilirubin, UA: NEGATIVE
Glucose, UA: NEGATIVE
Ketones, UA: NEGATIVE
Leukocytes,UA: NEGATIVE
Nitrite, UA: NEGATIVE
Protein,UA: NEGATIVE
RBC, UA: NEGATIVE
Specific Gravity, UA: <1.005 — ABNORMAL LOW (ref 1.005–1.030)
Urobilinogen, Ur: 0.2 mg/dL (ref 0.2–1.0)
pH, UA: 5 (ref 5.0–7.5)

## 2021-02-14 LAB — MICROSCOPIC EXAMINATION
RBC, Urine: NONE SEEN /hpf (ref 0–2)
WBC, UA: NONE SEEN /hpf (ref 0–5)

## 2021-02-15 ENCOUNTER — Encounter: Payer: Self-pay | Admitting: Urology

## 2021-02-15 LAB — CULTURE, URINE COMPREHENSIVE

## 2021-02-18 ENCOUNTER — Telehealth: Payer: Self-pay | Admitting: *Deleted

## 2021-02-18 DIAGNOSIS — B958 Unspecified staphylococcus as the cause of diseases classified elsewhere: Secondary | ICD-10-CM

## 2021-02-18 MED ORDER — SULFAMETHOXAZOLE-TRIMETHOPRIM 800-160 MG PO TABS: 1.0000 | ORAL_TABLET | Freq: Two times a day (BID) | ORAL | 0 refills | Status: AC

## 2021-02-18 NOTE — Telephone Encounter (Signed)
Pt calls back informed her of the information below per Dr. Bernardo Heater. RX sent. Pt advised to d/c Keflex. Pt gave verbal understanding.

## 2021-02-18 NOTE — Telephone Encounter (Signed)
-----   Message from Abbie Sons, MD sent at 02/17/2021  9:46 AM EDT ----- Urine culture grew a low level of staph.  If symptoms have not improved would recommend changing her antibiotics to Septra DS 1 twice daily x7 days

## 2021-02-26 ENCOUNTER — Encounter: Payer: Self-pay | Admitting: Physician Assistant

## 2021-02-26 ENCOUNTER — Other Ambulatory Visit: Payer: Self-pay

## 2021-02-26 ENCOUNTER — Ambulatory Visit (INDEPENDENT_AMBULATORY_CARE_PROVIDER_SITE_OTHER): Payer: 59 | Admitting: Physician Assistant

## 2021-02-26 VITALS — BP 112/69 | HR 112 | Ht 64.0 in | Wt 160.0 lb

## 2021-02-26 DIAGNOSIS — N3010 Interstitial cystitis (chronic) without hematuria: Secondary | ICD-10-CM

## 2021-02-26 LAB — URINALYSIS, COMPLETE
Bilirubin, UA: NEGATIVE
Glucose, UA: NEGATIVE
Ketones, UA: NEGATIVE
Leukocytes,UA: NEGATIVE
Nitrite, UA: NEGATIVE
Protein,UA: NEGATIVE
Specific Gravity, UA: 1.025 (ref 1.005–1.030)
Urobilinogen, Ur: 1 mg/dL (ref 0.2–1.0)
pH, UA: 6 (ref 5.0–7.5)

## 2021-02-26 LAB — MICROSCOPIC EXAMINATION

## 2021-02-26 NOTE — Progress Notes (Signed)
02/26/2021 5:22 PM   Karina [name] 1971-02-01 [id number]  CC: Chief Complaint  Patient presents with  . Other   HPI: Karina [name] is a 50 y.o. female with PMH recurrent UTI, stress incontinence, and IC who presents today for evaluation of possible UTI.  She was seen in clinic most recently by Dr. Bernardo Heater 15 days ago for the same.  UA and microscopy at that visit were pan negative, however culture grew low colony count of Staph hemolyticus and she was treated with culture appropriate Bactrim DS twice daily x7 days.  Today, patient reports she completed her antibiotics as prescribed, however she continues to have low back pain and lower abdominal heaviness.  She states the symptoms are more similar to past UTIs than IC flares.  She has taken amitriptyline in the past and had bothersome mood side effects on this.  She does not wish to resume it.  In-office UA today positive for trace intact blood; urine microscopy with few+ bacteria.  PMH: Past Medical History:  Diagnosis Date  . Allergic genetic state   . Chronic constipation   . Colon polyp   . Complication of anesthesia    facial swelling after 1 bladder procedure  . Fibroid uterus   . History of cervical dysplasia   . IBS (irritable bowel syndrome)   . Interstitial cystitis   . Psoriasis   . Psoriatic arthritis [facility])     Surgical History: Past Surgical History:  Procedure Laterality Date  . bladder hydrodistentions    . BLADDER SURGERY    . CATARACT EXTRACTION W/PHACO Right 08/03/2019   Procedure: CATARACT EXTRACTION PHACO AND INTRAOCULAR LENS PLACEMENT ([place])  RIGHT TORIC LENS  00:21.4  13.7%  2.94;  Surgeon: Leandrew Koyanagi, MD;  Location: [place];  Service: Ophthalmology;  Laterality: Right;  . CATARACT EXTRACTION W/PHACO Left 08/24/2019   Procedure: CATARACT EXTRACTION PHACO AND INTRAOCULAR LENS PLACEMENT ([place]) LEFT TORIC LENS  0:20 13.6% 2.82;  Surgeon: Leandrew Koyanagi, MD;  Location:  [place];  Service: Ophthalmology;  Laterality: Left;  . CHOLECYSTECTOMY    . COLONOSCOPY    . COLONOSCOPY WITH PROPOFOL N/A 03/16/2020   Procedure: COLONOSCOPY WITH PROPOFOL;  Surgeon: Robert Bellow, MD;  Location: [facility];  Service: Endoscopy;  Laterality: N/A;  . ESOPHAGOGASTRODUODENOSCOPY    . ESOPHAGOGASTRODUODENOSCOPY (EGD) WITH PROPOFOL N/A 03/16/2020   Procedure: ESOPHAGOGASTRODUODENOSCOPY (EGD) WITH PROPOFOL;  Surgeon: Robert Bellow, MD;  Location: [facility];  Service: Endoscopy;  Laterality: N/A;  . FLEXIBLE SIGMOIDOSCOPY    . LIPOSUCTION TRUNK    . TONSILLECTOMY      Home Medications:  Allergies as of 02/26/2021      Reactions   Macrobid [nitrofurantoin Macrocrystal] Nausea And Vomiting, Rash   Tdap [tetanus-diphth-acell Pertussis] Swelling, Rash      Medication List       Accurate as of February 26, 2021  5:22 PM. If you have any questions, ask your nurse or doctor.        STOP taking these medications   cephALEXin 500 MG capsule Commonly known as: Keflex Stopped by: Debroah Loop, PA-C   fluconazole 100 MG tablet Commonly known as: DIFLUCAN Stopped by: Debroah Loop, PA-C     TAKE these medications   CHEWABLES MULTIVITAMIN PO Take by mouth.   mirabegron ER 50 MG Tb24 tablet Commonly known as: MYRBETRIQ Take 1 tablet (50 mg total) by mouth daily.   SYSTANE OP Apply to eye.   Uribel 118 MG Caps  Take 1 capsule (118 mg total) by mouth 3 (three) times daily as needed (Urinary frequency, urgency, burning).   Xiidra 5 % Soln Generic drug: Lifitegrast       Allergies:  Allergies  Allergen Reactions  . Macrobid [Nitrofurantoin Macrocrystal] Nausea And Vomiting and Rash  . Tdap [Tetanus-Diphth-Acell Pertussis] Swelling and Rash    Family History: Family History  Problem Relation Age of Onset  . Ulcers Mother   . Hypertension Father   . Prostate cancer Father   . Colon polyps Father   . Hypertension  Brother   . Colon polyps Brother   . Clotting disorder Brother   . Diabetes Paternal Grandfather   . Stroke Maternal Grandmother   . Migraines Neg Hx     Social History:   reports that she has never smoked. She has never used smokeless tobacco. She reports that she does not drink alcohol and does not use drugs.  Physical Exam: BP 112/69   Pulse (!) 112   Ht 5\' 4"  (1.626 m)   Wt 160 lb (72.6 kg)   BMI 27.46 kg/m   Constitutional:  Alert and oriented, no acute distress, nontoxic appearing HEENT: [place], AT Cardiovascular: No clubbing, cyanosis, or edema Respiratory: Normal respiratory effort, no increased work of breathing Skin: No rashes, bruises or suspicious lesions Neurologic: Grossly intact, no focal deficits, moving all 4 extremities Psychiatric: Normal mood and affect  Laboratory Data: Results for orders placed or performed in visit on 02/26/21  Microscopic Examination   Urine  Result Value Ref Range   WBC, UA 0-5 0 - 5 /hpf   RBC 0-2 0 - 2 /hpf   Epithelial Cells (non renal) 0-10 0 - 10 /hpf   Bacteria, UA Few None seen/Few  Urinalysis, Complete  Result Value Ref Range   Specific Gravity, UA 1.025 1.005 - 1.030   pH, UA 6.0 5.0 - 7.5   Color, UA Yellow Yellow   Appearance Ur Clear Clear   Leukocytes,UA Negative Negative   Protein,UA Negative Negative/Trace   Glucose, UA Negative Negative   Ketones, UA Negative Negative   RBC, UA Trace (A) Negative   Bilirubin, UA Negative Negative   Urobilinogen, Ur 1.0 0.2 - 1.0 mg/dL   Nitrite, UA Negative Negative   Microscopic Examination See below:    Assessment & Plan:   1. IC (interstitial cystitis) UA relatively benign today.  I believe her symptoms are more consistent with IC flare than acute cystitis.  Will repeat urine culture and send for atypical culture today to rule out infection.  If negative, I recommended starting bladder rescue treatments 2-3 times weekly x3 weeks.  Patient is in agreement with this plan. -  Urinalysis, Complete - CULTURE, URINE COMPREHENSIVE - Mycoplasma / ureaplasma culture  Return in about 1 week (around 03/05/2021) for Bladder rescue installations 2-3x/week x3 weeks.  Debroah Loop, PA-C  [facility] [address] [place], [place] [address] [phone]

## 2021-03-01 ENCOUNTER — Telehealth: Payer: Self-pay

## 2021-03-01 LAB — CULTURE, URINE COMPREHENSIVE

## 2021-03-01 NOTE — Telephone Encounter (Signed)
Ok per [place], [facility] notifying patient.

## 2021-03-01 NOTE — Telephone Encounter (Signed)
-----   Message from Debroah Loop, [place] sent at 03/01/2021  2:20 PM EDT ----- Culture negative for infection, ok to proceed with bladder rescue next week. ----- Message ----- From: Interface, [facility] Lab Results In Sent: 02/26/2021   4:37 PM EDT To: Debroah Loop, PA-C

## 2021-03-04 LAB — MYCOPLASMA / UREAPLASMA CULTURE
Mycoplasma hominis Culture: NEGATIVE
Ureaplasma urealyticum: NEGATIVE

## 2021-03-05 ENCOUNTER — Other Ambulatory Visit: Payer: Self-pay

## 2021-03-05 ENCOUNTER — Ambulatory Visit (INDEPENDENT_AMBULATORY_CARE_PROVIDER_SITE_OTHER): Payer: 59 | Admitting: Physician Assistant

## 2021-03-05 DIAGNOSIS — N3010 Interstitial cystitis (chronic) without hematuria: Secondary | ICD-10-CM | POA: Diagnosis not present

## 2021-03-05 MED ORDER — SODIUM BICARBONATE 8.4 % IV SOLN
11.0000 mL | Freq: Once | INTRAVENOUS | Status: AC
  Administered 2021-03-05: 11 mL

## 2021-03-05 NOTE — Progress Notes (Signed)
Bladder Rescue Solution Instillation  Due to IC patient is present today for a Rescue Solution Treatment.  Patient was cleaned and prepped in a sterile fashion with betadine and lidocaine 2% jelly was instilled into the urethra.  A 14 FR catheter was inserted, urine return was noted 11ml, urine was yellow in color.  Instilled a solution consisting of 56ml of Sodium Bicarb, 2 ml Lidocaine and 1 ml of Heparin. The catheter was then removed. Patient tolerated well, no complications were noted.   Performed by: Debroah Loop, PA-C and Bradly Bienenstock, CMA  Follow up/ Additional Notes: 2 days for bladder rescue #2 of 6

## 2021-03-06 LAB — URINALYSIS, COMPLETE
Bilirubin, UA: NEGATIVE
Glucose, UA: NEGATIVE
Ketones, UA: NEGATIVE
Leukocytes,UA: NEGATIVE
Nitrite, UA: NEGATIVE
Protein,UA: NEGATIVE
RBC, UA: NEGATIVE
Specific Gravity, UA: <1.005 — ABNORMAL LOW (ref 1.005–1.030)
Urobilinogen, Ur: 0.2 mg/dL (ref 0.2–1.0)
pH, UA: 6.5 (ref 5.0–7.5)

## 2021-03-06 LAB — MICROSCOPIC EXAMINATION
Bacteria, UA: NONE SEEN
RBC, Urine: NONE SEEN /hpf (ref 0–2)
WBC, UA: NONE SEEN /hpf (ref 0–5)

## 2021-03-07 ENCOUNTER — Other Ambulatory Visit: Payer: Self-pay

## 2021-03-07 ENCOUNTER — Ambulatory Visit (INDEPENDENT_AMBULATORY_CARE_PROVIDER_SITE_OTHER): Payer: 59 | Admitting: Physician Assistant

## 2021-03-07 DIAGNOSIS — N3010 Interstitial cystitis (chronic) without hematuria: Secondary | ICD-10-CM

## 2021-03-07 LAB — URINALYSIS, COMPLETE
Bilirubin, UA: NEGATIVE
Glucose, UA: NEGATIVE
Ketones, UA: NEGATIVE
Leukocytes,UA: NEGATIVE
Nitrite, UA: NEGATIVE
Protein,UA: NEGATIVE
RBC, UA: NEGATIVE
Specific Gravity, UA: 1.025 (ref 1.005–1.030)
Urobilinogen, Ur: 1 mg/dL (ref 0.2–1.0)
pH, UA: 7 (ref 5.0–7.5)

## 2021-03-07 LAB — MICROSCOPIC EXAMINATION

## 2021-03-07 MED ORDER — SODIUM BICARBONATE 8.4 % IV SOLN
11.0000 mL | Freq: Once | INTRAVENOUS | Status: AC
  Administered 2021-03-07: 11 mL

## 2021-03-07 NOTE — Progress Notes (Signed)
Bladder Rescue Solution Instillation  Due to IC patient is present today for a Rescue Solution Treatment.  Patient was cleaned and prepped in a sterile fashion with betadine.  A 14 FR catheter was inserted, urine return was noted 11ml, urine was yellow in color.  Instilled a solution consisting of 28ml of Sodium Bicarb, 2 ml Lidocaine and 1 ml of Heparin. The catheter was then removed. Patient tolerated well, no complications were noted.   Performed by: Debroah Loop, PA-C and Bradly Bienenstock, CMA  Follow up/ Additional Notes: Next week for bladder rescue #3 of 6

## 2021-03-11 ENCOUNTER — Ambulatory Visit (INDEPENDENT_AMBULATORY_CARE_PROVIDER_SITE_OTHER): Payer: 59 | Admitting: Physician Assistant

## 2021-03-11 ENCOUNTER — Other Ambulatory Visit: Payer: Self-pay

## 2021-03-11 DIAGNOSIS — N3010 Interstitial cystitis (chronic) without hematuria: Secondary | ICD-10-CM

## 2021-03-11 MED ORDER — SODIUM BICARBONATE 8.4 % IV SOLN
11.0000 mL | Freq: Once | INTRAVENOUS | Status: AC
  Administered 2021-03-11: 11 mL

## 2021-03-11 NOTE — Progress Notes (Signed)
Bladder Rescue Solution Instillation  Due to IC patient is present today for a Rescue Solution Treatment.  Patient was cleaned and prepped in a sterile fashion with betadine and lidocaine 2% jelly was instilled into the urethra.  A 14 FR catheter was inserted, urine return was noted 55ml, urine was yellow in color.  Instilled a solution consisting of 72ml of Sodium Bicarb, 2 ml Lidocaine and 1 ml of Heparin. The catheter was then removed. Patient tolerated well, no complications were noted.   Performed by: Debroah Loop, PA-C and Bradly Bienenstock, CMA  Additional Notes: Patient reports improved low back pain and urgency. She wishes to schedule week #3 of treatments.  Follow up: 3 days for bladder rescue #4 of 6

## 2021-03-12 LAB — URINALYSIS, COMPLETE
Bilirubin, UA: NEGATIVE
Glucose, UA: NEGATIVE
Ketones, UA: NEGATIVE
Leukocytes,UA: NEGATIVE
Nitrite, UA: NEGATIVE
Protein,UA: NEGATIVE
RBC, UA: NEGATIVE
Specific Gravity, UA: 1.02 (ref 1.005–1.030)
Urobilinogen, Ur: 1 mg/dL (ref 0.2–1.0)
pH, UA: 5.5 (ref 5.0–7.5)

## 2021-03-12 LAB — MICROSCOPIC EXAMINATION
Bacteria, UA: NONE SEEN
RBC, Urine: NONE SEEN /hpf (ref 0–2)
WBC, UA: NONE SEEN /hpf (ref 0–5)

## 2021-03-14 ENCOUNTER — Other Ambulatory Visit: Payer: Self-pay

## 2021-03-14 ENCOUNTER — Ambulatory Visit (INDEPENDENT_AMBULATORY_CARE_PROVIDER_SITE_OTHER): Payer: 59 | Admitting: Physician Assistant

## 2021-03-14 DIAGNOSIS — N3010 Interstitial cystitis (chronic) without hematuria: Secondary | ICD-10-CM | POA: Diagnosis not present

## 2021-03-14 MED ORDER — SODIUM BICARBONATE 8.4 % IV SOLN
11.0000 mL | Freq: Once | INTRAVENOUS | Status: AC
  Administered 2021-03-14: 11 mL

## 2021-03-14 NOTE — Progress Notes (Signed)
Bladder Rescue Solution Instillation  Due to IC patient is present today for a Rescue Solution Treatment.  Patient was cleaned and prepped in a sterile fashion with betadine and lidocaine 2% jelly was instilled into the urethra.  A 14 FR catheter was inserted, urine return was noted 17ml, urine was yellow in color.  Instilled a solution consisting of 58ml of Sodium Bicarb, 2 ml Lidocaine and 1 ml of Heparin. The catheter was then removed. Patient tolerated well, no complications were noted.   Performed by: Debroah Loop, PA-C and Bradly Bienenstock, CMA  Follow up/ Additional Notes: Next week for bladder rescue #5 of 6

## 2021-03-15 LAB — URINALYSIS, COMPLETE
Bilirubin, UA: NEGATIVE
Glucose, UA: NEGATIVE
Ketones, UA: NEGATIVE
Leukocytes,UA: NEGATIVE
Nitrite, UA: NEGATIVE
Protein,UA: NEGATIVE
Specific Gravity, UA: 1.03 (ref 1.005–1.030)
Urobilinogen, Ur: 1 mg/dL (ref 0.2–1.0)
pH, UA: 5.5 (ref 5.0–7.5)

## 2021-03-15 LAB — MICROSCOPIC EXAMINATION
Bacteria, UA: NONE SEEN
WBC, UA: NONE SEEN /hpf (ref 0–5)

## 2021-03-18 ENCOUNTER — Other Ambulatory Visit: Payer: Self-pay

## 2021-03-18 ENCOUNTER — Ambulatory Visit (INDEPENDENT_AMBULATORY_CARE_PROVIDER_SITE_OTHER): Payer: 59 | Admitting: Physician Assistant

## 2021-03-18 DIAGNOSIS — R109 Unspecified abdominal pain: Secondary | ICD-10-CM

## 2021-03-18 DIAGNOSIS — R10A2 Flank pain, left side: Secondary | ICD-10-CM

## 2021-03-18 DIAGNOSIS — N3010 Interstitial cystitis (chronic) without hematuria: Secondary | ICD-10-CM

## 2021-03-18 MED ORDER — SODIUM BICARBONATE 8.4 % IV SOLN
11.0000 mL | Freq: Once | INTRAVENOUS | Status: AC
  Administered 2021-03-18: 11 mL

## 2021-03-18 NOTE — Progress Notes (Signed)
Bladder Rescue Solution Instillation  Due to IC patient is present today for a Rescue Solution Treatment.  Patient was cleaned and prepped in a sterile fashion with betadine.  A 14 FR catheter was inserted, urine return was noted 75ml, urine was yellow in color.  Instilled a solution consisting of 63ml of Sodium Bicarb, 2 ml Lidocaine and 1 ml of Heparin. The catheter was then removed. Patient tolerated well, no complications were noted.   Performed by: Debroah Loop, PA-C and Bradly Bienenstock, CMA  Additional Notes: Patient reports left flank soreness. No CVA tenderness on exam, patient denies fever, chills, and vomiting. Mild nausea, unclear if present at baseline. Suspect MSK in origin, LT flank is tense on physical exam today. Will obtain RUS to rule out underlying hydro. Counseled patient on return precautions; she expressed understanding.  Follow up: 2 days for bladder rescue

## 2021-03-19 LAB — MICROSCOPIC EXAMINATION

## 2021-03-19 LAB — URINALYSIS, COMPLETE
Bilirubin, UA: NEGATIVE
Glucose, UA: NEGATIVE
Ketones, UA: NEGATIVE
Leukocytes,UA: NEGATIVE
Nitrite, UA: NEGATIVE
Protein,UA: NEGATIVE
RBC, UA: NEGATIVE
Specific Gravity, UA: 1.015 (ref 1.005–1.030)
Urobilinogen, Ur: 0.2 mg/dL (ref 0.2–1.0)
pH, UA: 5 (ref 5.0–7.5)

## 2021-03-21 ENCOUNTER — Other Ambulatory Visit: Payer: Self-pay

## 2021-03-21 ENCOUNTER — Ambulatory Visit (INDEPENDENT_AMBULATORY_CARE_PROVIDER_SITE_OTHER): Payer: 59 | Admitting: Physician Assistant

## 2021-03-21 DIAGNOSIS — N3010 Interstitial cystitis (chronic) without hematuria: Secondary | ICD-10-CM | POA: Diagnosis not present

## 2021-03-21 MED ORDER — SODIUM BICARBONATE 8.4 % IV SOLN
11.0000 mL | Freq: Once | INTRAVENOUS | Status: AC
  Administered 2021-03-21: 11 mL

## 2021-03-21 NOTE — Progress Notes (Signed)
Bladder Rescue Solution Instillation  Due to IC patient is present today for a Rescue Solution Treatment.  Patient was cleaned and prepped in a sterile fashion with betadine and lidocaine 2% jelly was instilled into the urethra.  A 14 FR catheter was inserted, urine return was noted 63ml, urine was yellow in color.  Instilled a solution consisting of 27ml of Sodium Bicarb, 2 ml Lidocaine and 1 ml of Heparin. The catheter was then removed. Patient tolerated well, no complications were noted.   Performed by: Debroah Loop, PA-C and Bradly Bienenstock, CMA  Additional Notes: Patient reports improvement without resolution of her low back and low abdomen pain after 6 bladder rescue treatments. I offered her additional bladder rescue vs [facility] vs pelvic floor PT; patient will see how she feels over the weekend and follow-up with me when she decides what she would prefer. She is not interested in pursuing [facility].  Follow up: Return if symptoms worsen or fail to improve.

## 2021-03-25 LAB — MICROSCOPIC EXAMINATION

## 2021-03-25 LAB — URINALYSIS, COMPLETE
Bilirubin, UA: NEGATIVE
Glucose, UA: NEGATIVE
Ketones, UA: NEGATIVE
Leukocytes,UA: NEGATIVE
Nitrite, UA: NEGATIVE
Protein,UA: NEGATIVE
RBC, UA: NEGATIVE
Specific Gravity, UA: 1.025 (ref 1.005–1.030)
Urobilinogen, Ur: 0.2 mg/dL (ref 0.2–1.0)
pH, UA: 5.5 (ref 5.0–7.5)

## 2021-04-30 ENCOUNTER — Ambulatory Visit
Admission: RE | Admit: 2021-04-30 | Discharge: 2021-04-30 | Disposition: A | Discharge status: Home / Self Care | Payer: 59 | Source: Ambulatory Visit | Attending: Physician Assistant | Admitting: Physician Assistant

## 2021-04-30 ENCOUNTER — Other Ambulatory Visit: Payer: Self-pay

## 2021-04-30 DIAGNOSIS — R109 Unspecified abdominal pain: Secondary | ICD-10-CM | POA: Diagnosis not present

## 2021-05-02 ENCOUNTER — Telehealth: Payer: Self-pay

## 2021-05-02 NOTE — Telephone Encounter (Signed)
Notified patient as advised, she expressed understanding.

## 2021-05-02 NOTE — Telephone Encounter (Signed)
-----   Message from Debroah Loop, [place] sent at 05/02/2021  9:16 AM EDT ----- RUS normal, no evidence of obstruction to explain her left flank pain. ----- Message ----- From: Interface, Rad Results In Sent: 05/02/2021   8:47 AM EDT To: Debroah Loop, PA-C

## 2021-05-31 ENCOUNTER — Ambulatory Visit: Payer: Self-pay | Admitting: Urology

## 2021-06-06 ENCOUNTER — Other Ambulatory Visit: Payer: Self-pay

## 2021-06-06 ENCOUNTER — Ambulatory Visit (INDEPENDENT_AMBULATORY_CARE_PROVIDER_SITE_OTHER): Payer: 59 | Admitting: Physician Assistant

## 2021-06-06 DIAGNOSIS — N3010 Interstitial cystitis (chronic) without hematuria: Secondary | ICD-10-CM | POA: Diagnosis not present

## 2021-06-06 DIAGNOSIS — N3946 Mixed incontinence: Secondary | ICD-10-CM

## 2021-06-06 MED ORDER — SODIUM BICARBONATE 8.4 % IV SOLN
11.0000 mL | Freq: Once | INTRAVENOUS | Status: AC
  Administered 2021-06-06: 11 mL

## 2021-06-06 NOTE — Progress Notes (Signed)
06/06/2021 4:15 PM   Karina [name] September 16, 1971 [id number]  CC: Chief Complaint  Patient presents with   Cystitis    HPI: Karina [name] is a 50 y.o. female with PMH IC, recurrent UTI, and mixed stress and urge incontinence who presents today for evaluation of dysuria.  She completed a course of 7 bladder rescue treatments on 03/21/2021.  Today she reports her IC symptoms of dysuria, low back pain, and pain between her shoulder blades improved following her bladder rescue series, however it returned within the past 2 weeks.  She thinks that stress may be contributory.  Additionally, she reports bothersome worsened urinary incontinence lately with both stress and urge features.  We had previously discussed possible referral to pelvic floor PT, which she would be interested in today.  Additionally, she would like to attempt a one-time bladder rescue treatment to see if it will improve her symptoms.  In-office UA and microscopy today pan negative.  PMH: Past Medical History:  Diagnosis Date   Allergic genetic state    Chronic constipation    Colon polyp    Complication of anesthesia    facial swelling after 1 bladder procedure   Fibroid uterus    History of cervical dysplasia    IBS (irritable bowel syndrome)    Interstitial cystitis    Psoriasis    Psoriatic arthritis ([place])     Surgical History: Past Surgical History:  Procedure Laterality Date   bladder hydrodistentions     BLADDER SURGERY     CATARACT EXTRACTION W/PHACO Right 08/03/2019   Procedure: CATARACT EXTRACTION PHACO AND INTRAOCULAR LENS PLACEMENT ([place])  RIGHT TORIC LENS  00:21.4  13.7%  2.94;  Surgeon: Leandrew Koyanagi, MD;  Location: [place];  Service: Ophthalmology;  Laterality: Right;   CATARACT EXTRACTION W/PHACO Left 08/24/2019   Procedure: CATARACT EXTRACTION PHACO AND INTRAOCULAR LENS PLACEMENT ([place]) LEFT TORIC LENS  0:20 13.6% 2.82;  Surgeon: Leandrew Koyanagi, MD;  Location: [place];  Service: Ophthalmology;  Laterality: Left;   CHOLECYSTECTOMY     COLONOSCOPY     COLONOSCOPY WITH PROPOFOL N/A 03/16/2020   Procedure: COLONOSCOPY WITH PROPOFOL;  Surgeon: Robert Bellow, MD;  Location: [facility];  Service: Endoscopy;  Laterality: N/A;   ESOPHAGOGASTRODUODENOSCOPY     ESOPHAGOGASTRODUODENOSCOPY (EGD) WITH PROPOFOL N/A 03/16/2020   Procedure: ESOPHAGOGASTRODUODENOSCOPY (EGD) WITH PROPOFOL;  Surgeon: Robert Bellow, MD;  Location: [place] [facility];  Service: Endoscopy;  Laterality: N/A;   FLEXIBLE SIGMOIDOSCOPY     LIPOSUCTION TRUNK     TONSILLECTOMY      Home Medications:  Allergies as of 06/06/2021       Reactions   Macrobid [nitrofurantoin Macrocrystal] Nausea And Vomiting, Rash   Tdap [tetanus-diphth-acell Pertussis] Swelling, Rash        Medication List        Accurate as of June 06, 2021  4:15 PM. If you have any questions, ask your nurse or doctor.          CHEWABLES MULTIVITAMIN PO Take by mouth.   mirabegron ER 50 MG Tb24 tablet Commonly known as: MYRBETRIQ Take 1 tablet (50 mg total) by mouth daily.   SYSTANE OP Apply to eye.   Uribel 118 MG Caps Take 1 capsule (118 mg total) by mouth 3 (three) times daily as needed (Urinary frequency, urgency, burning).   Xiidra 5 % Soln Generic drug: Lifitegrast        Allergies:  Allergies  Allergen Reactions   Macrobid [Nitrofurantoin  Macrocrystal] Nausea And Vomiting and Rash   Tdap [Tetanus-Diphth-Acell Pertussis] Swelling and Rash    Family History: Family History  Problem Relation Age of Onset   Ulcers Mother    Hypertension Father    Prostate cancer Father    Colon polyps Father    Hypertension Brother    Colon polyps Brother    Clotting disorder Brother    Diabetes Paternal Grandfather    Stroke Maternal Grandmother    Migraines Neg Hx     Social History:   reports that she has never smoked. She has never used smokeless tobacco. She reports that she  does not drink alcohol and does not use drugs.  Physical Exam: There were no vitals taken for this visit.  Constitutional:  Alert and oriented, no acute distress, nontoxic appearing HEENT: [place], AT Cardiovascular: No clubbing, cyanosis, or edema Respiratory: Normal respiratory effort, no increased work of breathing Skin: No rashes, bruises or suspicious lesions Neurologic: Grossly intact, no focal deficits, moving all 4 extremities Psychiatric: Normal mood and affect  Laboratory Data: Results for orders placed or performed in visit on 06/06/21  Microscopic Examination   Urine  Result Value Ref Range   WBC, UA 0-5 0 - 5 /hpf   RBC 0-2 0 - 2 /hpf   Epithelial Cells (non renal) 0-10 0 - 10 /hpf   Bacteria, UA None seen None seen/Few  Urinalysis, Complete  Result Value Ref Range   Specific Gravity, UA 1.025 1.005 - 1.030   pH, UA 6.5 5.0 - 7.5   Color, UA Yellow Yellow   Appearance Ur Clear Clear   Leukocytes,UA Negative Negative   Protein,UA Negative Negative/Trace   Glucose, UA Negative Negative   Ketones, UA Negative Negative   RBC, UA Negative Negative   Bilirubin, UA Negative Negative   Urobilinogen, Ur 0.2 0.2 - 1.0 mg/dL   Nitrite, UA Negative Negative   Microscopic Examination See below:    Assessment & Plan:   1. IC (interstitial cystitis) Symptoms previously improved on bladder rescue, no recurrent and likely associated with stress.  Okay to proceed with one-time bladder rescue today to see if it improves her symptoms, see separate procedure note for details. - Urinalysis, Complete - heparin-lidocaine-sod bicarb bladder irrigation (Bladder rescue for bladder instillation procedures) - Ambulatory referral to Physical Therapy  2. Mixed urge and stress incontinence Recently worsened and bothersome.  Patient is open to pelvic floor PT referral today, which I am placing. - Ambulatory referral to Physical Therapy  Return if symptoms worsen or fail to improve.  Debroah Loop, PA-C  [facility] [address] [place], [place] [address] [phone]

## 2021-06-07 LAB — URINALYSIS, COMPLETE
Bilirubin, UA: NEGATIVE
Glucose, UA: NEGATIVE
Ketones, UA: NEGATIVE
Leukocytes,UA: NEGATIVE
Nitrite, UA: NEGATIVE
Protein,UA: NEGATIVE
RBC, UA: NEGATIVE
Specific Gravity, UA: 1.025 (ref 1.005–1.030)
Urobilinogen, Ur: 0.2 mg/dL (ref 0.2–1.0)
pH, UA: 6.5 (ref 5.0–7.5)

## 2021-06-07 LAB — MICROSCOPIC EXAMINATION: Bacteria, UA: NONE SEEN

## 2021-08-01 ENCOUNTER — Emergency Department: Payer: 59

## 2021-08-01 ENCOUNTER — Emergency Department
Admission: EM | Admit: 2021-08-01 | Discharge: 2021-08-01 | Disposition: A | Discharge status: Home / Self Care | Payer: 59 | Attending: Emergency Medicine | Admitting: Emergency Medicine

## 2021-08-01 ENCOUNTER — Other Ambulatory Visit: Payer: Self-pay

## 2021-08-01 DIAGNOSIS — G43109 Migraine with aura, not intractable, without status migrainosus: Secondary | ICD-10-CM | POA: Diagnosis not present

## 2021-08-01 DIAGNOSIS — R200 Anesthesia of skin: Secondary | ICD-10-CM | POA: Insufficient documentation

## 2021-08-01 DIAGNOSIS — R531 Weakness: Secondary | ICD-10-CM | POA: Insufficient documentation

## 2021-08-01 DIAGNOSIS — G43909 Migraine, unspecified, not intractable, without status migrainosus: Secondary | ICD-10-CM | POA: Diagnosis not present

## 2021-08-01 LAB — COMPREHENSIVE METABOLIC PANEL
ALT: 19 U/L (ref 0–44)
AST: 18 U/L (ref 15–41)
Albumin: 4.2 g/dL (ref 3.5–5.0)
Alkaline Phosphatase: 57 U/L (ref 38–126)
Anion gap: 8 (ref 5–15)
BUN: 14 mg/dL (ref 6–20)
CO2: 27 mmol/L (ref 22–32)
Calcium: 9.3 mg/dL (ref 8.9–10.3)
Chloride: 102 mmol/L (ref 98–111)
Creatinine, Ser: 0.74 mg/dL (ref 0.44–1.00)
GFR, Estimated: >60 mL/min (ref >60)
Glucose, Bld: 94 mg/dL (ref 70–99)
Potassium: 3.5 mmol/L (ref 3.5–5.1)
Sodium: 137 mmol/L (ref 135–145)
Total Bilirubin: 1.7 mg/dL — ABNORMAL HIGH (ref 0.3–1.2)
Total Protein: 7.2 g/dL (ref 6.5–8.1)

## 2021-08-01 LAB — DIFFERENTIAL
Abs Immature Granulocytes: 0.02 10*3/uL (ref 0.00–0.07)
Basophils Absolute: 0 10*3/uL (ref 0.0–0.1)
Basophils Relative: 0 %
Eosinophils Absolute: 0 10*3/uL (ref 0.0–0.5)
Eosinophils Relative: 1 %
Immature Granulocytes: 0 %
Lymphocytes Relative: 28 %
Lymphs Abs: 2 10*3/uL (ref 0.7–4.0)
Monocytes Absolute: 0.4 10*3/uL (ref 0.1–1.0)
Monocytes Relative: 5 %
Neutro Abs: 4.5 10*3/uL (ref 1.7–7.7)
Neutrophils Relative %: 66 %

## 2021-08-01 LAB — PROTIME-INR
INR: 1 (ref 0.8–1.2)
Prothrombin Time: 13.2 seconds (ref 11.4–15.2)

## 2021-08-01 LAB — CBC
HCT: 42.2 % (ref 36.0–46.0)
Hemoglobin: 14.7 g/dL (ref 12.0–15.0)
MCH: 31.3 pg (ref 26.0–34.0)
MCHC: 34.8 g/dL (ref 30.0–36.0)
MCV: 90 fL (ref 80.0–100.0)
Platelets: 218 10*3/uL (ref 150–400)
RBC: 4.69 MIL/uL (ref 3.87–5.11)
RDW: 11.5 % (ref 11.5–15.5)
WBC: 6.9 10*3/uL (ref 4.0–10.5)
nRBC: 0 % (ref 0.0–0.2)

## 2021-08-01 LAB — CBG MONITORING, ED: Glucose-Capillary: 101 mg/dL — ABNORMAL HIGH (ref 70–99)

## 2021-08-01 LAB — APTT: aPTT: 32 seconds (ref 24–36)

## 2021-08-01 MED ORDER — PROCHLORPERAZINE MALEATE 10 MG PO TABS: 10.0000 mg | ORAL_TABLET | Freq: Four times a day (QID) | ORAL | 0 refills | Status: DC | PRN

## 2021-08-01 MED ORDER — SODIUM CHLORIDE 0.9% FLUSH
3.0000 mL | Freq: Once | INTRAVENOUS | Status: DC
Start: 2021-08-01 — End: 2021-08-01

## 2021-08-01 MED ORDER — KETOROLAC TROMETHAMINE 30 MG/ML IJ SOLN
30.0000 mg | Freq: Once | INTRAMUSCULAR | Status: AC
  Administered 2021-08-01: 30 mg via INTRAVENOUS
  Filled 2021-08-01: qty 1

## 2021-08-01 MED ORDER — METOCLOPRAMIDE HCL 5 MG/ML IJ SOLN
10.0000 mg | Freq: Once | INTRAMUSCULAR | Status: AC
  Administered 2021-08-01: 10 mg via INTRAVENOUS
  Filled 2021-08-01: qty 2

## 2021-08-01 NOTE — ED Notes (Signed)
Called [facility] spoke to Pandora and cancelled code stroke per Dr. Rory [facility]  [phone]

## 2021-08-01 NOTE — ED Triage Notes (Signed)
Pt to ER via POV with complaints of left sided arm and leg numbness that started at approx 1045 this morning.   States they feel weak and like she is going to fall. Reports that her left leg has been feeling numb for several days but today it also started feeling weak. Reports taking her BP at home and it reading as high as [id number] systolic. Also reports a bad headache, nausea, and lightheadedness. No facial droop noted.   Cbg in triage 101

## 2021-08-01 NOTE — ED Notes (Signed)
Called [facility] [place], to initiate code stroke  [phone]

## 2021-08-01 NOTE — ED Provider Notes (Signed)
[facility] Emergency Department Provider Note ____________________________________________   Event Date/Time   First MD Initiated Contact with Patient 08/01/21 1523     (approximate)  I have reviewed the triage vital signs and the nursing notes.   HISTORY  Chief Complaint Numbness    HPI Karina [name] is a 50 y.o. female with PMH as noted below including psoriatic arthritis and interstitial cystitis who presents with numbness in the left leg for the last 3 days, persistent course, and moving up to the left arm.  Today at around 10:45 AM she had acute onset of weakness in the left arm and leg, feeling that she was dragging her foot.  She also developed a posterior headache today.  The patient denies any history of similar prior headaches.  She denies any fever or neck stiffness.  She has no vision changes or difficulty speaking.  Past Medical History:  Diagnosis Date   Allergic genetic state    Chronic constipation    Colon polyp    Complication of anesthesia    facial swelling after 1 bladder procedure   Fibroid uterus    History of cervical dysplasia    IBS (irritable bowel syndrome)    Interstitial cystitis    Psoriasis    Psoriatic arthritis ([place])     Patient Active Problem List   Diagnosis Date Noted   Recurrent UTI 05/27/2020   IC (interstitial cystitis) 08/05/2018   Dysuria 08/05/2018   Incomplete bladder emptying 08/05/2018   Occipital headache 09/23/2017   Encounter for long-term (current) use of high-risk medication 12/26/2015   Family history of psoriasis 12/26/2015   Bilateral cataracts 04/26/2015   Gastroesophageal reflux disease without esophagitis 04/26/2015   History of prediabetes 04/26/2015   History of vitamin D deficiency 04/26/2015   Psoriatic arthritis ([place]) 04/26/2015   Female stress incontinence 05/30/2013   Gonadoblastoma, 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   Increased frequency of urination 05/30/2013   Other specified symptom  associated with female genital organs 05/30/2013   Other symptoms and signs involving the genitourinary system 05/30/2013   Uterine spasm 05/30/2013    Past Surgical History:  Procedure Laterality Date   bladder hydrodistentions     BLADDER SURGERY     CATARACT EXTRACTION W/PHACO Right 08/03/2019   Procedure: CATARACT EXTRACTION PHACO AND INTRAOCULAR LENS PLACEMENT ([place])  RIGHT TORIC LENS  00:21.4  13.7%  2.94;  Surgeon: Leandrew Koyanagi, MD;  Location: [place];  Service: Ophthalmology;  Laterality: Right;   CATARACT EXTRACTION W/PHACO Left 08/24/2019   Procedure: CATARACT EXTRACTION PHACO AND INTRAOCULAR LENS PLACEMENT ([place]) LEFT TORIC LENS  0:20 13.6% 2.82;  Surgeon: Leandrew Koyanagi, MD;  Location: [place];  Service: Ophthalmology;  Laterality: Left;   CHOLECYSTECTOMY     COLONOSCOPY     COLONOSCOPY WITH PROPOFOL N/A 03/16/2020   Procedure: COLONOSCOPY WITH PROPOFOL;  Surgeon: Robert Bellow, MD;  Location: [facility] ENDOSCOPY;  Service: Endoscopy;  Laterality: N/A;   ESOPHAGOGASTRODUODENOSCOPY     ESOPHAGOGASTRODUODENOSCOPY (EGD) WITH PROPOFOL N/A 03/16/2020   Procedure: ESOPHAGOGASTRODUODENOSCOPY (EGD) WITH PROPOFOL;  Surgeon: Robert Bellow, MD;  Location: [place] END[facility]COPY;  Service: Endoscopy;  Laterality: N/A;   FLEXIBLE SIGMOIDOSCOPY     LIPOSUCTION TRUNK     TONSILLECTOMY      Prior to Admission medications   Medication Sig Start Date End Date Taking? Authorizing Provider  Meth-Hyo-M Bl-Na Phos-Ph Sal (URIBEL) 118 MG CAPS Take 1 capsule (118 mg total) by mouth 3 (three) times daily as needed (Urinary  frequency, urgency, burning). 02/11/21   Stoioff, Ronda Fairly, MD  mirabegron ER (MYRBETRIQ) 50 MG TB24 tablet Take 1 tablet (50 mg total) by mouth daily. Patient not taking: No sig reported 05/25/20   Abbie Sons, MD  Pediatric Multivit-Minerals-C (CHEWABLES MULTIVITAMIN PO) Take by mouth. Patient not taking: No sig reported    [provider]   Polyethyl Glycol-Propyl Glycol (SYSTANE OP) Apply to eye. Patient not taking: No sig reported    [provider]  Shirley Friar 5 % SOLN  09/16/19   [provider]    Allergies Macrobid [nitrofurantoin macrocrystal] and Tdap [tetanus-diphth-acell pertussis]  Family History  Problem Relation Age of Onset   Ulcers Mother    Hypertension Father    Prostate cancer Father    Colon polyps Father    Hypertension Brother    Colon polyps Brother    Clotting disorder Brother    Diabetes Paternal Grandfather    Stroke Maternal Grandmother    Migraines Neg Hx     Social History Social History   Tobacco Use   Smoking status: Never   Smokeless tobacco: Never  Vaping Use   Vaping Use: Never used  Substance Use Topics   Alcohol use: No   Drug use: No    Review of Systems  Constitutional: No fever/chills. Eyes: No visual changes. ENT: No sore throat. Cardiovascular: Denies chest pain. Respiratory: Denies shortness of breath. Gastrointestinal: No vomiting or diarrhea.  Genitourinary: Negative for dysuria.  Musculoskeletal: Negative for back pain. Skin: Negative for rash. Neurological: Positive for headache.  ____________________________________________   PHYSICAL EXAM:  VITAL SIGNS: ED Triage Vitals  Enc Vitals Group     BP 08/01/21 1257 (!) 141/73     Pulse Rate 08/01/21 1257 98     Resp 08/01/21 1257 18     Temp 08/01/21 1257 98.2 F (36.8 C)     Temp Source 08/01/21 1257 Oral     SpO2 08/01/21 1257 100 %     Weight 08/01/21 1259 147 lb 0.8 oz (66.7 kg)     Height 08/01/21 1257 '5\' 4"'$  (1.626 m)     Head Circumference --      Peak Flow --      Pain Score 08/01/21 1257 10     Pain Loc --      Pain Edu? --      Excl. in [place]? --     Constitutional: Alert and oriented. Well appearing and in no acute distress. Eyes: Conjunctivae are normal.  EOMI.  PERRLA. Head: Atraumatic. Nose: No congestion/rhinnorhea. Mouth/Throat: Mucous membranes are moist.    Neck: Normal range of motion.  Nontender. Cardiovascular: Normal rate, regular rhythm. Good peripheral circulation. Respiratory: Normal respiratory effort.  No retractions. Gastrointestinal: No distention.  Musculoskeletal: Extremities warm and well perfused.  Neurologic:  Normal speech and language.  Mild decrease sensation to left upper and left lower extremity.  No pronator drift.  Slight drift on holding up of left arm.  Normal coordination with no ataxia on finger-to-nose. Skin:  Skin is warm and dry. No rash noted. Psychiatric: Mood and affect are normal. Speech and behavior are normal.  ____________________________________________   LABS (all labs ordered are listed, but only abnormal results are displayed)  Labs Reviewed  COMPREHENSIVE METABOLIC PANEL - Abnormal; Notable for the following components:      Result Value   Total Bilirubin 1.7 (*)    All other components within normal limits  CBG MONITORING, ED - Abnormal; Notable for the  following components:   Glucose-Capillary 101 (*)    All other components within normal limits  PROTIME-INR  APTT  CBC  DIFFERENTIAL  CBG MONITORING, ED  TROPONIN I (HIGH SENSITIVITY)  TROPONIN I (HIGH SENSITIVITY)   ____________________________________________  EKG  ED ECG REPORT I, Arta Silence, the attending physician, personally viewed and interpreted this ECG.  Date: 08/01/2021 EKG Time: 1316 Rate: 87 Rhythm: normal sinus rhythm QRS Axis: normal Intervals: normal ST/T Wave abnormalities: Borderline nonspecific T wave abnormalities Narrative Interpretation: no evidence of acute ischemia  ____________________________________________  RADIOLOGY  CT head: No ICH or other acute abnormality MRI brain: Pending MRI C-spine: Pending  ____________________________________________   PROCEDURES  Procedure(s) performed: No  Procedures  Critical Care performed:  No ____________________________________________   INITIAL IMPRESSION / ASSESSMENT AND PLAN / ED COURSE  Pertinent labs & imaging results that were available during my care of the patient were reviewed by me and considered in my medical decision making (see chart for details).   50 year old female with PMH as noted above presents with left-sided numbness for the last 3 days initially in the left leg and moving to left arm, today associated with possible weakness and headache.  Code stroke was activated in triage.  On exam the patient is overall well-appearing.  Exam is significant for very mild left upper extremity drift and weakness and decreased sensation in the left upper and lower extremities.  Neurologic exam is otherwise unremarkable.  CT head was obtained and shows no acute abnormality.  Dr. Rory Percy from neurology was consulted and came to evaluate the patient in the ED.  Differential includes acute stroke, cervical radiculopathy, complex migraine.  Given the time course, Dr. Malen Gauze does not recommend tPA.  He recommends to give a migraine cocktail, obtain an MR of the brain and cervical spine, and reassess.  ----------------------------------------- 3:49 PM on 08/01/2021 -----------------------------------------  MR brain and cervical spine are currently pending.  I signed the patient out to the oncoming ED physician Dr. Charna Archer.   ____________________________________________   FINAL CLINICAL IMPRESSION(S) / ED DIAGNOSES  Final diagnoses:  Left sided numbness      NEW MEDICATIONS STARTED DURING THIS VISIT:  New Prescriptions   No medications on file     Note:  This document was prepared using Dragon voice recognition software and may include unintentional dictation errors.    Arta Silence, MD 08/01/21 1549

## 2021-08-01 NOTE — Progress Notes (Signed)
Chaplain Maggie responded to Code Stroke to ED2 and attempted to visit patient who was being cared for by the team. No family present. Continued support available per on call chaplain.

## 2021-08-01 NOTE — ED Notes (Signed)
CODE STROKE CANCELLED BY NEURO AT BEDSIDE

## 2021-08-01 NOTE — Consult Note (Signed)
CODE STROKE- PHARMACY COMMUNICATION   Time CODE STROKE called/page received:1312  Time response to CODE STROKE was made (in person or via phone): 1317  Time Stroke Kit retrieved from [place] (only if needed):1317-returned to pyxis no TPA given Code stroke canceled.  Name of Provider/Nurse contacted:Arora  Past Medical History:  Diagnosis Date   Allergic genetic state    Chronic constipation    Colon polyp    Complication of anesthesia    facial swelling after 1 bladder procedure   Fibroid uterus    History of cervical dysplasia    IBS (irritable bowel syndrome)    Interstitial cystitis    Psoriasis    Psoriatic arthritis ([place])    Prior to Admission medications   Medication Sig Start Date End Date Taking? Authorizing Provider  Meth-Hyo-M Bl-Na Phos-Ph Sal (URIBEL) 118 MG CAPS Take 1 capsule (118 mg total) by mouth 3 (three) times daily as needed (Urinary frequency, urgency, burning). 02/11/21   Stoioff, Ronda Fairly, MD  mirabegron ER (MYRBETRIQ) 50 MG TB24 tablet Take 1 tablet (50 mg total) by mouth daily. Patient not taking: No sig reported 05/25/20   Abbie Sons, MD  Pediatric Multivit-Minerals-C (CHEWABLES MULTIVITAMIN PO) Take by mouth. Patient not taking: No sig reported    [provider]  Polyethyl Glycol-Propyl Glycol (SYSTANE OP) Apply to eye. Patient not taking: No sig reported    [provider]  Shirley Friar 5 % SOLN  09/16/19   [provider]    Berta Minor ,PharmD Clinical Pharmacist  08/01/2021  1:58 PM

## 2021-08-01 NOTE — ED Provider Notes (Signed)
-----------------------------------------   3:23 PM on 08/01/2021 -----------------------------------------  Blood pressure 97/76, pulse 89, temperature 98.2 F (36.8 C), temperature source Oral, resp. rate 16, height '5\' 4"'$  (1.626 m), weight 66.7 kg, SpO2 97 %.  Assuming care from Dr. Cherylann Banas.  In short, Karina [name] is a 50 y.o. female with a chief complaint of Numbness .  Refer to the original H&P for additional details.  The current plan of care is to follow-up MRI results for headache and numbness.  ----------------------------------------- 5:47 PM on 08/01/2021 ----------------------------------------- MRI brain is negative for acute process, MRI of cervical spine shows mild foraminal stenosis, unlikely to be the source of patient's symptoms.  On reassessment, patient reports headache has resolved and numbness to her left side is significantly improved.  Findings discussed with Dr. Rory Percy from neurology, who agrees with plan for discharge and outpatient follow-up.  She was counseled to return to the ED for new worsening symptoms, patient agrees with plan.    Blake Divine, MD 08/01/21 [phone]

## 2021-08-01 NOTE — ED Notes (Signed)
MRI screening completed.

## 2021-08-01 NOTE — Progress Notes (Signed)
MRI brain negative for acute process. MRI c-spine with multi level DJD - no cord or significant impingement of roots - some progressive degenerative changes at lower cervical levels Per Dr. Charna Archer, subjectively better with residual left leg numbness after migraine cocktail. Can repeat migraine cocktail PRN Outpatient f/u in 4-6 weeks ([facility] or [facility] per patient preference). Plan relayed to Dr Charna Archer via secure chat.  -- Amie Portland, MD Neurologist [facility] Pager: [phone]

## 2021-08-01 NOTE — Consult Note (Signed)
Neurology Consultation  Reason for Consult: Code stroke for left-sided weakness Referring Physician: Dr. Cherylann Banas  CC: Left-sided numbness and weakness  History is obtained from: Chart review, patient  HPI: Karina [name] is a 50 y.o. female past medical history of psoriatic arthritis, interstitial cystitis, history of headaches that has not been a problem for many years now-had seen Dr. Lavell Anchors in 2018-presents today via private vehicle for sudden onset of left-sided weakness.  She describes her story as follows: Reports having had some numbness in her left leg been going on now for 2 to 3 days-unclear exact time, this moved on to involve the left arm but this morning around 10:45 AM, she felt that her left arm was weaker and she was dragging her foot.  She also reports some pain in the back of the neck going all the way up to the nape of her neck and posterior head that started today.  This headache is different than her prior headaches  I do see some history from Dr. Chong Sicilian clinic visit in 2018 that she had some numbness and tingling in fingers and toes with her headaches that she has had before but she did not complain of any such headache this time-she said this headache feels different than for what she had been seen by neurology in the past. She has had work up for possible high cervical spine lesion causing the headahes at that time-and to me, some of her headache description today sounded like similar to what she had described then, but subjectively she feels that this headache today is different and she never had any weakness with her headaches before this.  She was seen at the ER triage and a code stroke was activated due to the sudden onset of the weakness although the numbness in the symptoms in general have been going on now for 3 days.  Noncontrasted head CT was done as a part of code stroke evaluation was unremarkable  Denies any chest pain or shortness of breath.  Denies any  palpitations.  Denies any new visual changes-reports that she has had photophobia since her cataract surgeries.  Reports somewhat increased usual life stressors.  LKW: 3 days ago tpa given?: no, outside the window Premorbid modified Rankin scale (mRS): 0  ROS: Full ROS was performed and is negative except as noted in the HPI.   Past Medical History:  Diagnosis Date   Allergic genetic state    Chronic constipation    Colon polyp    Complication of anesthesia    facial swelling after 1 bladder procedure   Fibroid uterus    History of cervical dysplasia    IBS (irritable bowel syndrome)    Interstitial cystitis    Psoriasis    Psoriatic arthritis ([place])    Family History  Problem Relation Age of Onset   Ulcers Mother    Hypertension Father    Prostate cancer Father    Colon polyps Father    Hypertension Brother    Colon polyps Brother    Clotting disorder Brother    Diabetes Paternal Grandfather    Stroke Maternal Grandmother    Migraines Neg Hx   Mother had multiple TIAs at a young age and strokes.   Social History:   reports that she has never smoked. She has never used smokeless tobacco. She reports that she does not drink alcohol and does not use drugs. Denies smoking, illicit drug or alcohol abuse  Medications  Current Facility-Administered Medications:  ketorolac (TORADOL) 30 MG/ML injection 30 mg, 30 mg, Intravenous, Once, Karina Silence, MD   metoCLOPramide (REGLAN) injection 10 mg, 10 mg, Intravenous, Once, Karina Silence, MD   sodium chloride flush (NS) 0.9 % injection 3 mL, 3 mL, Intravenous, Once, Karina Silence, MD  Current Outpatient Medications:    Meth-Hyo-M Bl-Na Phos-Ph Sal (URIBEL) 118 MG CAPS, Take 1 capsule (118 mg total) by mouth 3 (three) times daily as needed (Urinary frequency, urgency, burning)., Disp: 90 capsule, Rfl: 3   mirabegron ER (MYRBETRIQ) 50 MG TB24 tablet, Take 1 tablet (50 mg total) by mouth daily., Disp: 30 tablet,  Rfl: 6   Pediatric Multivit-Minerals-C (CHEWABLES MULTIVITAMIN PO), Take by mouth., Disp: , Rfl:    Polyethyl Glycol-Propyl Glycol (SYSTANE OP), Apply to eye., Disp: , Rfl:    XIIDRA 5 % SOLN, , Disp: , Rfl:    Exam: Current vital signs: BP (!) 141/73   Pulse 98   Temp 98.2 F (36.8 C) (Oral)   Resp 18   Ht '5\' 4"'$  (1.626 m)   Wt 66.7 kg   SpO2 100%   BMI 25.24 kg/m  Vital signs in last 24 hours: Temp:  [98.2 F (36.8 C)] 98.2 F (36.8 C) (08/25 1257) Pulse Rate:  [98] 98 (08/25 1257) Resp:  [18] 18 (08/25 1257) BP: (141)/(73) 141/73 (08/25 1257) SpO2:  [100 %] 100 % (08/25 1257) Weight:  [66.7 kg] 66.7 kg (08/25 1259) GENERAL: Awake, alert in NAD HEENT: - Normocephalic and atraumatic, dry mm, no LN++, no Thyromegally LUNGS - Clear to auscultation bilaterally with no wheezes CV - S1S2 RRR, no m/r/g, equal pulses bilaterally. ABDOMEN - Soft, nontender, nondistended with normoactive BS Ext: warm, well perfused, intact peripheral pulses NEURO:  Mental Status: AA&Ox3  Language: speech is nondysarthric.  Naming, repetition, fluency, and comprehension intact. Cranial Nerves: PERRLEOMI, visual fields full, no facial asymmetry, facial sensation reveals mildly diminished sensation on the left in comparison to the right-also diminished vibratory sense on the forehead on the left, hearing intact, tongue/uvula/soft palate midline, normal sternocleidomastoid and trapezius muscle strength. No evidence of tongue atrophy or fibrillations Motor: On individual muscle testing, she is nearly symmetric 5/5 in all fours.  On asking her to raise her arms up, she did not have any pronator drift on the left but the left arm had slight vertical drift.  It also looks like she was having a little bit of a hard time keeping her left leg up against gravity but it did not drift vertically to be scored in the NIH stroke scale. Tone: is normal and bulk is normal Sensation-light touch is diminished on the left in  comparison to the right.  Vibratory sense is intact on the upper extremities but diminished on the left leg in comparison to the right leg and diminished on the left forehead in comparison to the right forehead Coordination: FTN intact bilaterally, no ataxia in BLE. Gait- deferred  NIHSS-1 sensory   Labs I have reviewed labs in epic and the results pertinent to this consultation are:   CBC    Component Value Date/Time   WBC 6.9 08/01/2021 1321   RBC 4.69 08/01/2021 1321   HGB 14.7 08/01/2021 1321   HCT 42.2 08/01/2021 1321   PLT 218 08/01/2021 1321   MCV 90.0 08/01/2021 1321   MCH 31.3 08/01/2021 1321   MCHC 34.8 08/01/2021 1321   RDW 11.5 08/01/2021 1321   LYMPHSABS 2.0 08/01/2021 1321   MONOABS 0.4 08/01/2021 1321   EOSABS 0.0  08/01/2021 1321   BASOSABS 0.0 08/01/2021 1321    CMP  No results found for: NA, K, CL, CO2, GLUCOSE, BUN, CREATININE, CALCIUM, PROT, ALBUMIN, AST, ALT, ALKPHOS, BILITOT, GFRNONAA, GFRAA   Imaging I have reviewed the images obtained: Today's CT-head-no acute changes.  Aspects 10 MRI examination of the brain from 2018-normal MRI of the cervical spine from 2018-this protrusions at C5-6 and C6-7 that did not lead to significant spinal stenosis or nerve root compression.  Spinal cord normal.  Assessment:  50 year old woman with above past medical history presents for worsening of her neurological symptoms that she has been experiencing for the past 3 days. Reports that symptoms started with some numbness on the left arm and leg yesterday and progressed to some weakness on the left side. On examination she does have some subjective sensory deficits on the left side and a very subtle left arm and leg weakness without any vertical drift. Reports of a headache that started this morning as well.  Although there are some inconsistencies in the exam-especially sensory exam, they were not very significant-given her unilateral symptoms, history of autoimmune  disease-psoriatic arthritis, prior history of headaches and family history of strokes in young my differentials are as below: - Rule out stroke/demyelination - Rule out C-spine pathology - Description does not fit tension headache but could be or could be complicated migraine type presentation if imaging remains unremarkable  Recommendations: MRI brain without contrast MRI C-spine without contrast Will consider contrasted studies with there is any signal abnormality in the noncontrasted studies Migraine cocktail-IV Toradol/Reglan/Benadryl. Every 2 hour neurochecks for the next 4 hours followed by every 4 hour neurochecks.  Discussed my plan in detail with the patient, and her husband at bedside. Discussed my plan with Dr. Serina Cowper Will follow imaging  -- Amie Portland, MD Neurologist [facility] Pager: [phone]

## 2021-08-01 NOTE — ED Notes (Signed)
Patient transported to MRI 

## 2022-01-30 ENCOUNTER — Other Ambulatory Visit: Payer: Self-pay | Admitting: Obstetrics and Gynecology

## 2022-02-13 ENCOUNTER — Ambulatory Visit: Payer: Self-pay | Admitting: Urology

## 2022-02-26 ENCOUNTER — Other Ambulatory Visit: Payer: Self-pay

## 2022-02-26 ENCOUNTER — Encounter: Payer: Self-pay | Admitting: Urology

## 2022-02-26 ENCOUNTER — Ambulatory Visit (INDEPENDENT_AMBULATORY_CARE_PROVIDER_SITE_OTHER): Payer: 59 | Admitting: Urology

## 2022-02-26 VITALS — BP 119/76 | HR 102 | Ht 64.0 in | Wt 146.0 lb

## 2022-02-26 DIAGNOSIS — N390 Urinary tract infection, site not specified: Secondary | ICD-10-CM

## 2022-02-26 DIAGNOSIS — N3946 Mixed incontinence: Secondary | ICD-10-CM

## 2022-02-26 DIAGNOSIS — N3010 Interstitial cystitis (chronic) without hematuria: Secondary | ICD-10-CM

## 2022-02-27 LAB — MICROSCOPIC EXAMINATION
Bacteria, UA: NONE SEEN
WBC, UA: NONE SEEN /hpf (ref 0–5)

## 2022-02-27 LAB — URINALYSIS, COMPLETE
Bilirubin, UA: NEGATIVE
Glucose, UA: NEGATIVE
Ketones, UA: NEGATIVE
Leukocytes,UA: NEGATIVE
Nitrite, UA: NEGATIVE
Protein,UA: NEGATIVE
RBC, UA: NEGATIVE
Specific Gravity, UA: 1.01 (ref 1.005–1.030)
Urobilinogen, Ur: 0.2 mg/dL (ref 0.2–1.0)
pH, UA: 6.5 (ref 5.0–7.5)

## 2022-02-27 NOTE — Progress Notes (Signed)
? ?02/26/2022 ?9:51 AM  ? ?Karina [name] ?1971/01/01 ?[id number] ? ?Referring provider: No referring provider defined for this encounter. ? ?Chief Complaint  ?Patient presents with  ? Other  ? ? ?Urologic history: ?1.  Interstitial cystitis ?-Diagnosed by Dr. Jacqlyn Larsen ?-managed with Lynnda Shields and Myrbetriq as needed ?  ?2.  Recurrent UTI ?-Demand therapy with cephalexin ?-Yeast vaginitis with antibiotic therapy ?  ?3.  Stress urinary incontinence ? ? ?HPI: ?51 y.o. female presents for annual follow-up. ? ?Saw [name] shortly after last year's visit with symptom flare and underwent weekly rescue intravesical treatments x7 ?Last seen July 2022 and she was not interested and pelvic floor rehab for urge/stress incontinence.  She states she was never contacted to schedule the appointment. ?Overall her symptoms have been stable ?She is having some menstrual irregularities which she feels exacerbates her bladder symptoms.  Scheduled for diagnostic laparoscopy next month ?Continues Uribel as needed for symptom flares ?Has also requested to restart Myrbetriq ? ? ?PMH: ?Past Medical History:  ?Diagnosis Date  ? Allergic genetic state   ? Chronic constipation   ? Colon polyp   ? Complication of anesthesia   ? facial swelling after 1 bladder procedure  ? Fibroid uterus   ? History of cervical dysplasia   ? IBS (irritable bowel syndrome)   ? Interstitial cystitis   ? Psoriasis   ? Psoriatic arthritis ([place])   ? ? ?Surgical History: ?Past Surgical History:  ?Procedure Laterality Date  ? bladder hydrodistentions    ? BLADDER SURGERY    ? CATARACT EXTRACTION W/PHACO Right 08/03/2019  ? Procedure: CATARACT EXTRACTION PHACO AND INTRAOCULAR LENS PLACEMENT ([place])  RIGHT TORIC LENS  00:21.4  13.7%  2.94;  Surgeon: Leandrew Koyanagi, MD;  Location: [place];  Service: Ophthalmology;  Laterality: Right;  ? CATARACT EXTRACTION W/PHACO Left 08/24/2019  ? Procedure: CATARACT EXTRACTION PHACO AND INTRAOCULAR LENS PLACEMENT ([place])  LEFT TORIC LENS  0:20 13.6% 2.82;  Surgeon: Leandrew Koyanagi, MD;  Location: [place];  Service: Ophthalmology;  Laterality: Left;  ? CHOLECYSTECTOMY    ? COLONOSCOPY    ? COLONOSCOPY WITH PROPOFOL N/A 03/16/2020  ? Procedure: COLONOSCOPY WITH PROPOFOL;  Surgeon: Robert Bellow, MD;  [facility] ENDOSCOPY;  Service: Endoscopy;  Laterality: N/A;  ? ESOPHAGOGASTRODUODENOSCOPY    ? ESOPHAGOGASTRODUODENOSCOPY (EGD) WITH PROPOFOL N/A 03/16/2020  ? Procedure: ESOPHAGOGASTRODUODENOSCOPY (EGD) WITH PROPOFOL;  Surgeon: Robert Bellow, MD;  Location: [facility] ENDOSCOPY;  Service: Endoscopy;  Laterality: N/A;  ? FLEXIBLE SIGMOIDOSCOPY    ? LIPOSUCTION TRUNK    ? TONSILLECTOMY    ? ? ?Home Medications:  ?Allergies as of 02/26/2022   ? ?   Reactions  ? Macrobid [nitrofurantoin Macrocrystal] Nausea And Vomiting, Rash  ? Tdap [tetanus-diphth-acell Pertussis] Swelling, Rash  ? ?  ? ?  ?Medication List  ?  ? ?  ? Accurate as of February 26, 2022 11:59 PM. If you have any questions, ask your nurse or doctor.  ?  ?  ? ?  ? ?CHEWABLES MULTIVITAMIN PO ?Take by mouth. ?  ?mirabegron ER 50 MG Tb24 tablet ?Commonly known as: MYRBETRIQ ?Take 1 tablet (50 mg total) by mouth daily. ?  ?prochlorperazine 10 MG tablet ?Commonly known as: COMPAZINE ?Take 1 tablet (10 mg total) by mouth every 6 (six) hours as needed for nausea or vomiting. ?  ?SYSTANE OP ?Apply to eye. ?  ?Uribel 118 MG Caps ?Take 1 capsule (118 mg total) by mouth 3 (three) times daily as  needed (Urinary frequency, urgency, burning). ?  ?Xiidra 5 % Soln ?Generic drug: Lifitegrast ?  ? ?  ? ? ?Allergies:  ?Allergies  ?Allergen Reactions  ? Macrobid [Nitrofurantoin Macrocrystal] Nausea And Vomiting and Rash  ? Tdap [Tetanus-Diphth-Acell Pertussis] Swelling and Rash  ? ? ?Family History: ?Family History  ?Problem Relation Age of Onset  ? Ulcers Mother   ? Hypertension Father   ? Prostate cancer Father   ? Colon polyps Father   ? Hypertension Brother   ? Colon polyps  Brother   ? Clotting disorder Brother   ? Diabetes Paternal Grandfather   ? Stroke Maternal Grandmother   ? Migraines Neg Hx   ? ? ?Social History:  reports that she has never smoked. She has never used smokeless tobacco. She reports that she does not drink alcohol and does not use drugs. ? ? ?Physical Exam: ?BP 119/76   Pulse (!) 102   Ht '5\' 4"'$  (1.626 m)   Wt 146 lb (66.2 kg)   BMI 25.06 kg/m?   ?Constitutional:  Alert and oriented, No acute distress. ?HEENT: [place] AT, moist mucus membranes.  Trachea midline, no masses. ?Cardiovascular: No clubbing, cyanosis, or edema. ?Respiratory: Normal respiratory effort, no increased work of breathing. ?Psychiatric: Normal mood and affect. ? ?Laboratory Data: ? ?Urinalysis ?Pending ? ?Assessment & Plan:   ? ?1.  Chronic interstitial cystitis ?Uribel refilled ?Rx Myrbetriq sent to pharmacy ? ?2.  Recurrent UTI ?Cephalexin and fluconazole refill ? ?3.  Stress urinary incontinence ?Stable ?PT referral placed 06/06/2021.  We will have admin staff check with PT department ? ? ?Abbie Sons, MD ? ?[facility] ?[address], [address] ?[place], [place] [address] ?[phone] ? ?

## 2022-03-03 ENCOUNTER — Encounter: Payer: Self-pay | Admitting: Urology

## 2022-03-03 ENCOUNTER — Other Ambulatory Visit: Payer: Self-pay | Admitting: *Deleted

## 2022-03-03 MED ORDER — CEPHALEXIN 500 MG PO CAPS: 500.0000 mg | ORAL_CAPSULE | Freq: Three times a day (TID) | ORAL | 1 refills | Status: AC

## 2022-03-03 MED ORDER — URIBEL 118 MG PO CAPS: 1.0000 | ORAL_CAPSULE | Freq: Three times a day (TID) | ORAL | 3 refills | Status: DC | PRN

## 2022-03-03 MED ORDER — FLUCONAZOLE 100 MG PO TABS: 100.0000 mg | ORAL_TABLET | Freq: Every day | ORAL | 3 refills | Status: DC

## 2022-03-03 MED ORDER — MIRABEGRON ER 50 MG PO TB24: 50.0000 mg | ORAL_TABLET | Freq: Every day | ORAL | 6 refills | Status: DC

## 2022-04-01 ENCOUNTER — Encounter (HOSPITAL_BASED_OUTPATIENT_CLINIC_OR_DEPARTMENT_OTHER): Payer: Self-pay | Admitting: Obstetrics and Gynecology

## 2022-04-01 ENCOUNTER — Other Ambulatory Visit: Payer: Self-pay

## 2022-04-01 NOTE — Progress Notes (Signed)
Spoke w/ via phone for pre-op interview: patient ?Lab needs dos: CBC and UPT ?Lab results: NA ?COVID test: patient states asymptomatic no test needed. ?Arrive at [place] 04/04/22 ?NPO after MN except clear liquids.Clear liquids from MN until 0430 ?Med rec completed. ?Medications to take morning of surgery: myrbetriq and uribel PRN ?Diabetic medication: NA ?Patient instructed no nail polish to be worn day of surgery. ?Patient instructed to bring photo id and insurance card day of surgery. ?Patient aware to have Driver (ride ) / caregiver for 24 hours after surgery. (Husband or mom to drive) ?Patient Special Instructions: NA ?Pre-Op special Istructions: NA ?Patient verbalized understanding of instructions that were given at this phone interview. ?Patient denies shortness of breath, chest pain, fever, cough at this phone interview.  ?

## 2022-04-04 ENCOUNTER — Ambulatory Visit (HOSPITAL_BASED_OUTPATIENT_CLINIC_OR_DEPARTMENT_OTHER)
Admission: RE | Admit: 2022-04-04 | Discharge: 2022-04-04 | Disposition: A | Discharge status: Home / Self Care | Payer: 59 | Source: Ambulatory Visit | Attending: Obstetrics and Gynecology | Admitting: Obstetrics and Gynecology

## 2022-04-04 ENCOUNTER — Ambulatory Visit (HOSPITAL_BASED_OUTPATIENT_CLINIC_OR_DEPARTMENT_OTHER): Payer: 59 | Admitting: Anesthesiology

## 2022-04-04 ENCOUNTER — Encounter (HOSPITAL_BASED_OUTPATIENT_CLINIC_OR_DEPARTMENT_OTHER)
Admission: RE | Disposition: A | Discharge status: Home / Self Care | Payer: Self-pay | Source: Ambulatory Visit | Attending: Obstetrics and Gynecology

## 2022-04-04 ENCOUNTER — Other Ambulatory Visit: Payer: Self-pay

## 2022-04-04 ENCOUNTER — Encounter (HOSPITAL_BASED_OUTPATIENT_CLINIC_OR_DEPARTMENT_OTHER): Payer: Self-pay | Admitting: Obstetrics and Gynecology

## 2022-04-04 DIAGNOSIS — N840 Polyp of corpus uteri: Secondary | ICD-10-CM

## 2022-04-04 DIAGNOSIS — N938 Other specified abnormal uterine and vaginal bleeding: Secondary | ICD-10-CM | POA: Insufficient documentation

## 2022-04-04 HISTORY — DX: Nausea with vomiting, unspecified: R11.2

## 2022-04-04 HISTORY — DX: Other specified postprocedural states: Z98.890

## 2022-04-04 HISTORY — PX: HYSTEROSCOPY WITH D & C: SHX1775

## 2022-04-04 LAB — CBC
HCT: 42.9 % (ref 36.0–46.0)
Hemoglobin: 14.5 g/dL (ref 12.0–15.0)
MCH: 31.9 pg (ref 26.0–34.0)
MCHC: 33.8 g/dL (ref 30.0–36.0)
MCV: 94.3 fL (ref 80.0–100.0)
Platelets: 203 10*3/uL (ref 150–400)
RBC: 4.55 MIL/uL (ref 3.87–5.11)
RDW: 11.8 % (ref 11.5–15.5)
WBC: 5.6 10*3/uL (ref 4.0–10.5)
nRBC: 0 % (ref 0.0–0.2)

## 2022-04-04 LAB — POCT PREGNANCY, URINE: Preg Test, Ur: NEGATIVE

## 2022-04-04 SURGERY — DILATATION AND CURETTAGE /HYSTEROSCOPY
Anesthesia: General | Site: Uterus

## 2022-04-04 MED ORDER — FENTANYL CITRATE (PF) 100 MCG/2ML IJ SOLN
INTRAMUSCULAR | Status: AC
Start: 2022-04-04 — End: ?
  Filled 2022-04-04: qty 2

## 2022-04-04 MED ORDER — PROPOFOL 500 MG/50ML IV EMUL
INTRAVENOUS | Status: DC | PRN
  Administered 2022-04-04: 25 ug/kg/min via INTRAVENOUS

## 2022-04-04 MED ORDER — DEXAMETHASONE SODIUM PHOSPHATE 10 MG/ML IJ SOLN
INTRAMUSCULAR | Status: AC
  Filled 2022-04-04: qty 1

## 2022-04-04 MED ORDER — PROPOFOL 500 MG/50ML IV EMUL
INTRAVENOUS | Status: AC
  Filled 2022-04-04: qty 50

## 2022-04-04 MED ORDER — OXYCODONE HCL 5 MG PO TABS: 5.0000 mg | ORAL_TABLET | Freq: Once | ORAL | Status: DC | PRN

## 2022-04-04 MED ORDER — FENTANYL CITRATE (PF) 100 MCG/2ML IJ SOLN: 25.0000 ug | INTRAMUSCULAR | Status: DC | PRN

## 2022-04-04 MED ORDER — LACTATED RINGERS IV SOLN: INTRAVENOUS | Status: DC

## 2022-04-04 MED ORDER — PROPOFOL 10 MG/ML IV BOLUS
INTRAVENOUS | Status: AC
  Filled 2022-04-04: qty 20

## 2022-04-04 MED ORDER — LIDOCAINE 2% (20 MG/ML) 5 ML SYRINGE
INTRAMUSCULAR | Status: DC | PRN
Start: 2022-04-04 — End: 2022-04-04
  Administered 2022-04-04: 60 mg via INTRAVENOUS

## 2022-04-04 MED ORDER — LIDOCAINE HCL (PF) 2 % IJ SOLN
INTRAMUSCULAR | Status: AC
  Filled 2022-04-04: qty 5

## 2022-04-04 MED ORDER — KETOROLAC TROMETHAMINE 30 MG/ML IJ SOLN
INTRAMUSCULAR | Status: DC | PRN
  Administered 2022-04-04: 30 mg via INTRAVENOUS

## 2022-04-04 MED ORDER — DEXAMETHASONE SODIUM PHOSPHATE 10 MG/ML IJ SOLN
INTRAMUSCULAR | Status: DC | PRN
  Administered 2022-04-04: 10 mg via INTRAVENOUS

## 2022-04-04 MED ORDER — ONDANSETRON HCL 4 MG/2ML IJ SOLN
INTRAMUSCULAR | Status: AC
Start: 2022-04-04 — End: ?
  Filled 2022-04-04: qty 2

## 2022-04-04 MED ORDER — PROPOFOL 10 MG/ML IV BOLUS
INTRAVENOUS | Status: DC | PRN
  Administered 2022-04-04: 200 mg via INTRAVENOUS

## 2022-04-04 MED ORDER — SODIUM CHLORIDE 0.9 % IR SOLN
Status: DC | PRN
  Administered 2022-04-04: 3000 mL

## 2022-04-04 MED ORDER — IBUPROFEN 800 MG PO TABS: 800.0000 mg | ORAL_TABLET | Freq: Three times a day (TID) | ORAL | 11 refills | Status: AC | PRN

## 2022-04-04 MED ORDER — ACETAMINOPHEN 500 MG PO TABS
1000.0000 mg | ORAL_TABLET | Freq: Once | ORAL | Status: AC
  Administered 2022-04-04: 1000 mg via ORAL

## 2022-04-04 MED ORDER — MIDAZOLAM HCL 2 MG/2ML IJ SOLN
INTRAMUSCULAR | Status: AC
  Filled 2022-04-04: qty 2

## 2022-04-04 MED ORDER — KETOROLAC TROMETHAMINE 30 MG/ML IJ SOLN: 30.0000 mg | Freq: Once | INTRAMUSCULAR | Status: DC | PRN

## 2022-04-04 MED ORDER — AMISULPRIDE (ANTIEMETIC) 5 MG/2ML IV SOLN: 10.0000 mg | Freq: Once | INTRAVENOUS | Status: DC | PRN

## 2022-04-04 MED ORDER — POVIDONE-IODINE 10 % EX SWAB: 2.0000 "application " | Freq: Once | CUTANEOUS | Status: DC

## 2022-04-04 MED ORDER — FENTANYL CITRATE (PF) 100 MCG/2ML IJ SOLN
INTRAMUSCULAR | Status: DC | PRN
  Administered 2022-04-04 (×2): 50 ug via INTRAVENOUS

## 2022-04-04 MED ORDER — KETOROLAC TROMETHAMINE 30 MG/ML IJ SOLN
INTRAMUSCULAR | Status: AC
  Filled 2022-04-04: qty 1

## 2022-04-04 MED ORDER — OXYCODONE HCL 5 MG/5ML PO SOLN: 5.0000 mg | Freq: Once | ORAL | Status: DC | PRN

## 2022-04-04 MED ORDER — ARTIFICIAL TEARS OPHTHALMIC OINT
TOPICAL_OINTMENT | OPHTHALMIC | Status: AC
  Filled 2022-04-04: qty 3.5

## 2022-04-04 MED ORDER — ACETAMINOPHEN 500 MG PO TABS
ORAL_TABLET | ORAL | Status: AC
  Filled 2022-04-04: qty 2

## 2022-04-04 MED ORDER — MIDAZOLAM HCL 2 MG/2ML IJ SOLN
INTRAMUSCULAR | Status: DC | PRN
Start: 2022-04-04 — End: 2022-04-04
  Administered 2022-04-04: 2 mg via INTRAVENOUS

## 2022-04-04 MED ORDER — ONDANSETRON HCL 4 MG/2ML IJ SOLN
INTRAMUSCULAR | Status: DC | PRN
  Administered 2022-04-04: 4 mg via INTRAVENOUS

## 2022-04-04 SURGICAL SUPPLY — 23 items
CATH ROBINSON RED A/P 16FR (CATHETERS) IMPLANT
DEVICE MYOSURE LITE (MISCELLANEOUS) IMPLANT
DEVICE MYOSURE REACH (MISCELLANEOUS) ×1 IMPLANT
DILATOR CANAL MILEX (MISCELLANEOUS) ×1 IMPLANT
DRSG TELFA 3X8 NADH (GAUZE/BANDAGES/DRESSINGS) ×2 IMPLANT
GAUZE 4X4 16PLY ~~LOC~~+RFID DBL (SPONGE) ×2 IMPLANT
GLOVE BIOGEL PI IND STRL 7.0 (GLOVE) ×1 IMPLANT
GLOVE BIOGEL PI INDICATOR 7.0 (GLOVE) ×1
GLOVE ECLIPSE 6.5 STRL STRAW (GLOVE) ×2 IMPLANT
GOWN STRL REUS W/TWL LRG LVL3 (GOWN DISPOSABLE) ×2 IMPLANT
IV NS IRRIG 3000ML ARTHROMATIC (IV SOLUTION) ×2 IMPLANT
KIT PROCEDURE FLUENT (KITS) ×2 IMPLANT
KIT TURNOVER CYSTO (KITS) ×2 IMPLANT
MYOSURE XL FIBROID (MISCELLANEOUS)
PACK VAGINAL MINOR WOMEN LF (CUSTOM PROCEDURE TRAY) ×2 IMPLANT
PAD DRESSING TELFA 3X8 NADH (GAUZE/BANDAGES/DRESSINGS) ×1 IMPLANT
PAD OB MATERNITY 4.3X12.25 (PERSONAL CARE ITEMS) ×2 IMPLANT
PAD PREP 24X48 CUFFED NSTRL (MISCELLANEOUS) ×2 IMPLANT
SEAL CERVICAL OMNI LOK (ABLATOR) IMPLANT
SEAL ROD LENS SCOPE MYOSURE (ABLATOR) ×2 IMPLANT
SYSTEM TISS REMOVAL MYOSURE XL (MISCELLANEOUS) IMPLANT
TOWEL OR 17X26 10 PK STRL BLUE (TOWEL DISPOSABLE) ×2 IMPLANT
WATER STERILE IRR 500ML POUR (IV SOLUTION) ×2 IMPLANT

## 2022-04-04 NOTE — Anesthesia Procedure Notes (Signed)
Procedure Name: LMA Insertion ?Date/Time: 04/04/2022 7:39 AM ?Performed by: Mechele Claude, CRNA ?Pre-anesthesia Checklist: Patient identified, Emergency Drugs available, Suction available and Patient being monitored ?Patient Re-evaluated:Patient Re-evaluated prior to induction ?Oxygen Delivery Method: Circle system utilized ?Preoxygenation: Pre-oxygenation with 100% oxygen ?Induction Type: IV induction ?Ventilation: Mask ventilation without difficulty ?LMA: LMA inserted ?LMA Size: 4.0 ?Number of attempts: 1 ?Airway Equipment and Method: Bite block ?Placement Confirmation: positive ETCO2 ?Tube secured with: Tape ?Dental Injury: Teeth and Oropharynx as per pre-operative assessment  ? ? ? ? ?

## 2022-04-04 NOTE — Interval H&P Note (Signed)
History and Physical Interval Note: ? ?04/04/2022 ?7:28 AM ? ?Karina [name]  has presented today for surgery, with the diagnosis of abnormal uterine bleeding, endometrial polyp.  The various methods of treatment have been discussed with the patient and family. After consideration of risks, benefits and other options for treatment, the patient has consented to  PROCEDURES: Diagnostic hysteroscopy, hysteroscopic resection of endometrial polyps, dilation and curettage as a surgical intervention.  The patient's history has been reviewed, patient examined, no change in status, stable for surgery.  I have reviewed the patient's chart and labs.  Questions were answered to the patient's satisfaction.   ? ? ?[name] ? ? ?

## 2022-04-04 NOTE — Brief Op Note (Signed)
04/04/2022 ? ?8:33 AM ? ?PATIENT:  Karina [name]  51 y.o. female ? ?PRE-OPERATIVE DIAGNOSIS:  abnormal uterine bleeding, endometrial polyp ? ?POST-OPERATIVE DIAGNOSIS:  abnormal uterine bleeding, endometrial polyp ? ?PROCEDURE:  diagnostic hysteroscopy, D&C, hysteroscopic resection of endometrial polyps using myosure ? ?SURGEON:  Surgeon(s) and Role: ?   Servando Salina, MD - Primary ? ?PHYSICIAN ASSISTANT:  ? ?ASSISTANTS: none  ? ?ANESTHESIA:   general ?FINDINGS: multiple uterine endometrial polyps, thickened posterior endometrium, nl endocervical canal. Both tubal ostia seen ?EBL:  10 mL  ? ?BLOOD ADMINISTERED:none ? ?DRAINS: none  ? ?LOCAL MEDICATIONS USED:  NONE ? ?SPECIMEN:  Source of Specimen:  EMC with polyps ? ?DISPOSITION OF SPECIMEN:  PATHOLOGY ? ?COUNTS:  YES ? ?TOURNIQUET:  * No tourniquets in log * ? ?DICTATION: .Other Dictation: Dictation Number [id number] ? ?PLAN OF CARE: Discharge to home after PACU ? ?PATIENT DISPOSITION:  PACU - hemodynamically stable. ?  ?Delay start of Pharmacological VTE agent (>24hrs) due to surgical blood loss or risk of bleeding: no ? ?

## 2022-04-04 NOTE — Anesthesia Postprocedure Evaluation (Signed)
Anesthesia Post Note ? ?Patient: Karina [name] ? ?Procedure(s) Performed: DILATATION AND CURETTAGE /HYSTEROSCOPY/MYOSURE (Uterus) ? ?  ? ?Patient location during evaluation: PACU ?Anesthesia Type: General ?Level of consciousness: awake ?Pain management: pain level controlled ?Vital Signs Assessment: post-procedure vital signs reviewed and stable ?Respiratory status: spontaneous breathing, nonlabored ventilation, respiratory function stable and patient connected to nasal cannula oxygen ?Cardiovascular status: blood pressure returned to baseline and stable ?Postop Assessment: no apparent nausea or vomiting ?Anesthetic complications: no ? ? ?No notable events documented. ? ?Last Vitals:  ?Vitals:  ? 04/04/22 0900 04/04/22 0933  ?BP: (!) 101/55 101/77  ?Pulse: 80 82  ?Resp: 15 18  ?Temp: 36.9 ?C 36.6 ?C  ?SpO2: 97% 98%  ?  ?Last Pain:  ?Vitals:  ? 04/04/22 0933  ?TempSrc:   ?PainSc: 0-No pain  ? ? ?  ?  ?  ?  ?  ?  ? ?Leonie Amacher P Madeleyn Schwimmer ? ? ? ? ?

## 2022-04-04 NOTE — Transfer of Care (Signed)
Immediate Anesthesia Transfer of Care Note ? ?Patient: Karina [name] ? ?Procedure(s) Performed: Procedure(s) (LRB): ?DILATATION AND CURETTAGE /HYSTEROSCOPY/MYOSURE (N/A) ? ?Patient Location: PACU ? ?Anesthesia Type: General ? ?Level of Consciousness: awake, alert  and oriented ? ?Airway & Oxygen Therapy: Patient Spontanous Breathing ? ?Post-op Assessment: Report given to PACU RN and Post -op Vital signs reviewed and stable ? ?Post vital signs: Reviewed and stable ? ?Complications: No apparent anesthesia complications ? ?Last Vitals:  ?Vitals Value Taken Time  ?BP 96/50 04/04/22 0818  ?Temp    ?Pulse 84 04/04/22 0821  ?Resp 13 04/04/22 0821  ?SpO2 97 % 04/04/22 0821  ?Vitals shown include unvalidated device data. ? ?Last Pain:  ?Vitals:  ? 04/04/22 0601  ?TempSrc: Oral  ?PainSc: 2   ?   ? ?Patients Stated Pain Goal: 5 (04/04/22 0601) ? ?Complications: No notable events documented. ?

## 2022-04-04 NOTE — Anesthesia Preprocedure Evaluation (Addendum)
Anesthesia Evaluation  ?Patient identified by MRN, date of birth, ID band ?Patient awake ? ? ? ?Reviewed: ?Allergy & Precautions, NPO status , Patient's Chart, lab work & pertinent test results ? ?History of Anesthesia Complications ?(+) PONV and history of anesthetic complications ? ?Airway ?Mallampati: II ? ?TM Distance: >3 FB ?Neck ROM: Full ? ? ? Dental ?no notable dental hx. ? ?  ?Pulmonary ?neg pulmonary ROS,  ?  ?Pulmonary exam normal ? ? ? ? ? ? ? Cardiovascular ?negative cardio ROS ?Normal cardiovascular exam ? ? ?  ?Neuro/Psych ? Headaches, negative psych ROS  ? GI/Hepatic ?negative GI ROS, Neg liver ROS,   ?Endo/Other  ?negative endocrine ROS ? Renal/GU ?Interstitial cystitis  ? ?  ?Musculoskeletal ? ?(+) Arthritis ,  ? Abdominal ?  ?Peds ? Hematology ?negative hematology ROS ?(+)   ?Anesthesia Other Findings ?abnormal uterine bleeding  ?endometrial polyp ? Reproductive/Obstetrics ?hcg negative ? ?  ? ? ? ? ? ? ? ? ? ? ? ? ? ?  ?  ? ? ? ? ? ? ? ?Anesthesia Physical ?Anesthesia Plan ? ?ASA: 2 ? ?Anesthesia Plan: General  ? ?Post-op Pain Management:   ? ?Induction: Intravenous ? ?PONV Risk Score and Plan: 4 or greater and Ondansetron, Dexamethasone, Propofol infusion, Midazolam and Treatment may vary due to age or medical condition ? ?Airway Management Planned: LMA ? ?Additional Equipment:  ? ?Intra-op Plan:  ? ?Post-operative Plan: Extubation in OR ? ?Informed Consent: I have reviewed the patients History and Physical, chart, labs and discussed the procedure including the risks, benefits and alternatives for the proposed anesthesia with the patient or authorized representative who has indicated his/her understanding and acceptance.  ? ? ? ?Dental advisory given ? ?Plan Discussed with: CRNA ? ?Anesthesia Plan Comments:   ? ? ? ? ? ?Anesthesia Quick Evaluation ? ?

## 2022-04-04 NOTE — Discharge Instructions (Addendum)
DISCHARGE INSTRUCTIONS: HYSTEROSCOPY  ?The following instructions have been prepared to help you care for yourself upon your return home. ? ? ?May take Ibuprofen after 2:00pm today. ? ?Tylenol after 12:30pm today. ? ?May take stool softner while taking narcotic pain medication to prevent constipation.  Drink plenty of water. ? ?Personal hygiene: ? Use sanitary pads for vaginal drainage, not tampons. ? Shower the day after your procedure. ? NO tub baths, pools or Jacuzzis for 2-3 weeks. ? Wipe front to back after using the bathroom. ? ?Activity and limitations: ? Do NOT drive or operate any equipment for 24 hours. The effects of anesthesia are still present ?and drowsiness may result. ? Do NOT rest in bed all day. ? Walking is encouraged. ? Walk up and down stairs slowly. ? You may resume your normal activity in one to two days or as indicated by your physician. ?Sexual activity: NO intercourse for at least 2 weeks after the procedure, or as indicated by your ?Doctor. ? ?Diet: Eat a light meal as desired this evening. You may resume your usual diet tomorrow. ? ?Return to Work: You may resume your work activities in one to two days or as indicated by your ?Doctor. ? ?What to expect after your surgery: Expect to have vaginal bleeding/discharge for 2-3 days and ?spotting for up to 10 days. It is not unusual to have soreness for up to 1-2 weeks. You may have a ?slight burning sensation when you urinate for the first day. Mild cramps may continue for a couple of ?days. You may have a regular period in 2-6 weeks. ? ?Call your doctor for any of the following: ? Excessive vaginal bleeding or clotting, saturating and changing one pad every hour. ? Inability to urinate 6 hours after discharge from hospital. ? Pain not relieved by pain medication. ? Fever of 100.4? F or greater. ? Unusual vaginal discharge or odor. ? ?Return to office _________________Call for an appointment ___________________ ?Patient?s signature:  ______________________ ?Nurse?s signature ________________________ ? ?Post Anesthesia Care UnitCALL  IF TEMP>100.4, NOTHING PER VAGINA X 1 WK, CALL IF SOAKING A MAXI  PAD EVERY HOUR OR MORE FREQUENTLY  ? ? ? ?Post Anesthesia Home Care Instructions ? ?Activity: ?Get plenty of rest for the remainder of the day. A responsible individual must stay with you for 24 hours following the procedure.  ?For the next 24 hours, DO NOT: ?-Drive a car ?-Paediatric nurse ?-Drink alcoholic beverages ?-Take any medication unless instructed by your physician ?-Make any legal decisions or sign important papers. ? ?Meals: ?Start with liquid foods such as gelatin or soup. Progress to regular foods as tolerated. Avoid greasy, spicy, heavy foods. If nausea and/or vomiting occur, drink only clear liquids until the nausea and/or vomiting subsides. Call your physician if vomiting continues. ? ?Special Instructions/Symptoms: ?Your throat may feel dry or sore from the anesthesia or the breathing tube placed in your throat during surgery. If this causes discomfort, gargle with warm salt water. The discomfort should disappear within 24 hours. ? ?If you had a scopolamine patch placed behind your ear for the management of post- operative nausea and/or vomiting: ? ?1. The medication in the patch is effective for 72 hours, after which it should be removed.  Wrap patch in a tissue and discard in the trash. Wash hands thoroughly with soap and water. ?2. You may remove the patch earlier than 72 hours if you experience unpleasant side effects which may include dry mouth, dizziness or visual disturbances. ?3. Avoid touching  the patch. Wash your hands with soap and water after contact with the patch. ?    ? ?

## 2022-04-04 NOTE — H&P (Signed)
Karina [name] is an 51 y.o. female. G1P1 MWF presents for surgical mgmt of DUB, endometrial polyps/masse via dx hysteroscopyc, D&C, hysteroscopic resection of endometrial masses ? ?Pertinent Gynecological History: ?Menses:  irregular prolonged ?Bleeding: dysfunctional uterine bleeding ?Contraception: none ?DES exposure: denies ?Blood transfusions: none ?Sexually transmitted diseases: no past history ?Previous GYN Procedures: cryo  ?Last mammogram: normal Date: 2023 ?Last pap: normal Date: 2023 ?OB History: G1, P1  ? ?Menstrual History: ?Menarche age: n/a ?No LMP recorded (lmp unknown). ?  ? ?Past Medical History:  ?Diagnosis Date  ? Allergic genetic state   ? Chronic constipation   ? Colon polyp   ? Complication of anesthesia   ? facial swelling after 1 bladder procedure  ? Fibroid uterus   ? History of cervical dysplasia   ? IBS (irritable bowel syndrome)   ? Interstitial cystitis   ? PONV (postoperative nausea and vomiting)   ? Psoriasis   ? Psoriatic arthritis ([place])   ? ? ?Past Surgical History:  ?Procedure Laterality Date  ? bladder hydrodistentions    ? BLADDER SURGERY    ? CATARACT EXTRACTION W/PHACO Right 08/03/2019  ? Procedure: CATARACT EXTRACTION PHACO AND INTRAOCULAR LENS PLACEMENT ([place])  RIGHT TORIC LENS  00:21.4  13.7%  2.94;  Surgeon: Leandrew Koyanagi, MD;  Location: [place];  Service: Ophthalmology;  Laterality: Right;  ? CATARACT EXTRACTION W/PHACO Left 08/24/2019  ? Procedure: CATARACT EXTRACTION PHACO AND INTRAOCULAR LENS PLACEMENT ([place]) LEFT TORIC LENS  0:20 13.6% 2.82;  Surgeon: Leandrew Koyanagi, MD;  Location: [place];  Service: Ophthalmology;  Laterality: Left;  ? CHOLECYSTECTOMY    ? COLONOSCOPY    ? COLONOSCOPY WITH PROPOFOL N/A 03/16/2020  ? Procedure: COLONOSCOPY WITH PROPOFOL;  Surgeon: Robert Bellow, MD;  Location: [facility] ENDOSCOPY;  Service: Endoscopy;  Laterality: N/A;  ? ESOPHAGOGASTRODUODENOSCOPY    ? ESOPHAGOGASTRODUODENOSCOPY (EGD) WITH PROPOFOL N/A  03/16/2020  ? Procedure: ESOPHAGOGASTRODUODENOSCOPY (EGD) WITH PROPOFOL;  Surgeon: Robert Bellow, MD;  Location: [facility] ENDOSCOPY;  Service: Endoscopy;  Laterality: N/A;  ? FLEXIBLE SIGMOIDOSCOPY    ? LIPOSUCTION TRUNK    ? TONSILLECTOMY    ? ? ?Family History  ?Problem Relation Age of Onset  ? Ulcers Mother   ? Hypertension Father   ? Prostate cancer Father   ? Colon polyps Father   ? Hypertension Brother   ? Colon polyps Brother   ? Clotting disorder Brother   ? Diabetes Paternal Grandfather   ? Stroke Maternal Grandmother   ? Migraines Neg Hx   ? ? ?Social History:  reports that she has never smoked. She has never used smokeless tobacco. She reports that she does not drink alcohol and does not use drugs. ? ?Allergies:  ?Allergies  ?Allergen Reactions  ? Macrobid [Nitrofurantoin Macrocrystal] Nausea And Vomiting and Rash  ? Tdap [Tetanus-Diphth-Acell Pertussis] Swelling and Rash  ? ? ?Medications Prior to Admission  ?Medication Sig Dispense Refill Last Dose  ? mirabegron ER (MYRBETRIQ) 50 MG TB24 tablet Take 1 tablet (50 mg total) by mouth daily. 30 tablet 6 Past Month  ? Pediatric Multivit-Minerals-C (CHEWABLES MULTIVITAMIN PO) Take by mouth.   Past Week  ? XIIDRA 5 % SOLN    04/04/2022 at 0445  ? fluconazole (DIFLUCAN) 100 MG tablet Take 1 tablet (100 mg total) by mouth daily. 3 tablet 3 More than a month  ? Meth-Hyo-M Bl-Na Phos-Ph Sal (URIBEL) 118 MG CAPS Take 1 capsule (118 mg total) by mouth 3 (three) times daily as needed (Urinary  frequency, urgency, burning). 90 capsule 3 More than a month  ? Polyethyl Glycol-Propyl Glycol (SYSTANE OP) Apply to eye.   More than a month  ? prochlorperazine (COMPAZINE) 10 MG tablet Take 1 tablet (10 mg total) by mouth every 6 (six) hours as needed for nausea or vomiting. 12 tablet 0   ? ? ?Review of Systems  ?All other systems reviewed and are negative. ? ?Blood pressure (!) 114/53, pulse 88, temperature 98.7 ?F (37.1 ?C), temperature source Oral, resp. rate 16, height '5\' 4"'$   (1.626 m), weight 67.4 kg, SpO2 100 %. ?Physical Exam ?Constitutional:   ?   Appearance: Normal appearance.  ?HENT:  ?   Head: Atraumatic.  ?   Mouth/Throat:  ?   Mouth: Mucous membranes are moist.  ?Eyes:  ?   Extraocular Movements: Extraocular movements intact.  ?Cardiovascular:  ?   Rate and Rhythm: Regular rhythm.  ?   Heart sounds: Normal heart sounds.  ?Pulmonary:  ?   Breath sounds: Normal breath sounds.  ?Abdominal:  ?   Palpations: Abdomen is soft.  ?Genitourinary: ?   General: Normal vulva.  ?   Comments: Vagina nl ?Cervix parous ?Uterus AV nl ?Adnexa nl ? ?Musculoskeletal:     ?   General: Normal range of motion.  ?   Cervical back: Neck supple.  ?Skin: ?   General: Skin is warm and dry.  ?Neurological:  ?   General: No focal deficit present.  ?   Mental Status: She is oriented to person, place, and time.  ?Psychiatric:     ?   Mood and Affect: Mood normal.     ?   Behavior: Behavior normal.  ? ? ?Results for orders placed or performed during the hospital encounter of 04/04/22 (from the past 24 hour(s))  ?Pregnancy, urine POC     Status: None  ? Collection Time: 04/04/22  5:38 AM  ?Result Value Ref Range  ? Preg Test, Ur NEGATIVE NEGATIVE  ? ? ?No results found. ? ?Assessment/Plan: ?DUB ?Endometrial mass ?P) dx hysteroscopy D&C, hysteroscopic resection of endometrial masses/polyp. Procedure explained. Risk of surgery reviewed including infection, bleeding, injury to surrounding organ structures, uterine perforation ( 12/998 and its risk). , thermal injury, fluid overload and its mgmt. All ? answered ? ?Genita Nilsson A Paquita Printy ?04/04/2022, 6:24 AM ? ?

## 2022-04-04 NOTE — Op Note (Signed)
NAME: [name], Karina B. ?MEDICAL RECORD NO: [id number] ?ACCOUNT NO: [id number] ?DATE OF BIRTH: 1971-01-19 ?FACILITY: [facility] ?LOCATION: [facility] ?PHYSICIAN: Meris Reede A. Garwin Brothers, MD ? ?Operative Report  ? ?DATE OF PROCEDURE: 04/04/2022 ? ?PREOPERATIVE DIAGNOSES:  Dysfunctional uterine bleeding, endometrial polyps. ? ?PROCEDURE:  Diagnostic hysteroscopy, hysteroscopic resection of endometrial polyps using MyoSure, dilation and curettage. ? ?POSTOPERATIVE DIAGNOSES:  Dysfunctional uterine bleeding, endometrial polyps. ? ?ANESTHESIA:  General. ? ?SURGEON:  Kord Monette A. Garwin Brothers, MD ? ?ASSISTANT:  None. ? ?DESCRIPTION OF PROCEDURE:  Under adequate general anesthesia, the patient was placed in the dorsal lithotomy position.  She was sterilely prepped and draped in the usual fashion.  The patient had voided prior to entering the room.  Examination under  ?anesthesia revealed anteverted uterus, no adnexal masses could be appreciated.  Bivalve speculum was placed in the vagina.  A single-tooth tenaculum was placed on the anterior lip of the cervix. A stenotic cervical os was encountered.  A cervical os  ?finder was subsequently used followed by serial dilation to #19 Pratt dilator.  A diagnostic hysteroscope was introduced into the uterine cavity without incident.  Both tubal ostia could be seen on either side with 2 polypoid lesions present and  ?endometrial thickening on the posterior wall.  Using the Reach resectoscope, the endometrial polyps as well as the endometrium was resected.  The endocervical canal was inspected.  No lesions were noted.  The procedure was terminated by removing all  ?instruments.  When all tissue that was visible was removed.  Specimen labeled endometrial curettings with polyp, sent to pathology.  Fluid deficit was 190 mL.  Complication was none.  The patient tolerated the procedure well and was transferred to  ?recovery room in stable condition. ? ? ?PUS ?D: 04/04/2022 8:39:50 am T: 04/04/2022  10:24:00 am  ?JOB: [id number]/ [id number]  ?

## 2022-04-07 ENCOUNTER — Encounter (HOSPITAL_BASED_OUTPATIENT_CLINIC_OR_DEPARTMENT_OTHER): Payer: Self-pay | Admitting: Obstetrics and Gynecology

## 2022-04-07 LAB — SURGICAL PATHOLOGY

## 2022-06-03 ENCOUNTER — Other Ambulatory Visit: Payer: Self-pay | Admitting: Urology

## 2022-12-20 ENCOUNTER — Emergency Department: Payer: 59

## 2022-12-20 ENCOUNTER — Emergency Department
Admission: EM | Admit: 2022-12-20 | Discharge: 2022-12-20 | Disposition: A | Discharge status: Home / Self Care | Payer: 59 | Attending: Emergency Medicine | Admitting: Emergency Medicine

## 2022-12-20 ENCOUNTER — Other Ambulatory Visit: Payer: Self-pay

## 2022-12-20 DIAGNOSIS — K567 Ileus, unspecified: Secondary | ICD-10-CM | POA: Insufficient documentation

## 2022-12-20 DIAGNOSIS — R7401 Elevation of levels of liver transaminase levels: Secondary | ICD-10-CM | POA: Diagnosis not present

## 2022-12-20 DIAGNOSIS — R1013 Epigastric pain: Secondary | ICD-10-CM | POA: Diagnosis present

## 2022-12-20 DIAGNOSIS — K529 Noninfective gastroenteritis and colitis, unspecified: Secondary | ICD-10-CM | POA: Insufficient documentation

## 2022-12-20 LAB — HEPATIC FUNCTION PANEL
ALT: 20 U/L (ref 0–44)
AST: 24 U/L (ref 15–41)
Albumin: 4.5 g/dL (ref 3.5–5.0)
Alkaline Phosphatase: 66 U/L (ref 38–126)
Bilirubin, Direct: 0.2 mg/dL (ref 0.0–0.2)
Indirect Bilirubin: 1.2 mg/dL — ABNORMAL HIGH (ref 0.3–0.9)
Total Bilirubin: 1.4 mg/dL — ABNORMAL HIGH (ref 0.3–1.2)
Total Protein: 7.6 g/dL (ref 6.5–8.1)

## 2022-12-20 LAB — URINALYSIS, ROUTINE W REFLEX MICROSCOPIC
Bilirubin Urine: NEGATIVE
Glucose, UA: NEGATIVE mg/dL
Hgb urine dipstick: NEGATIVE
Ketones, ur: NEGATIVE mg/dL
Leukocytes,Ua: NEGATIVE
Nitrite: NEGATIVE
Protein, ur: NEGATIVE mg/dL
Specific Gravity, Urine: 1.011 (ref 1.005–1.030)
pH: 5 (ref 5.0–8.0)

## 2022-12-20 LAB — CBC
HCT: 45.1 % (ref 36.0–46.0)
Hemoglobin: 15.1 g/dL — ABNORMAL HIGH (ref 12.0–15.0)
MCH: 30.7 pg (ref 26.0–34.0)
MCHC: 33.5 g/dL (ref 30.0–36.0)
MCV: 91.7 fL (ref 80.0–100.0)
Platelets: 233 10*3/uL (ref 150–400)
RBC: 4.92 MIL/uL (ref 3.87–5.11)
RDW: 11.5 % (ref 11.5–15.5)
WBC: 8.9 10*3/uL (ref 4.0–10.5)
nRBC: 0 % (ref 0.0–0.2)

## 2022-12-20 LAB — PREGNANCY, URINE: Preg Test, Ur: NEGATIVE

## 2022-12-20 LAB — TROPONIN I (HIGH SENSITIVITY)
Troponin I (High Sensitivity): <2 ng/L (ref <18)
Troponin I (High Sensitivity): <2 ng/L (ref <18)

## 2022-12-20 LAB — BASIC METABOLIC PANEL
Anion gap: 10 (ref 5–15)
BUN: 11 mg/dL (ref 6–20)
CO2: 22 mmol/L (ref 22–32)
Calcium: 9.3 mg/dL (ref 8.9–10.3)
Chloride: 106 mmol/L (ref 98–111)
Creatinine, Ser: 0.62 mg/dL (ref 0.44–1.00)
GFR, Estimated: >60 mL/min (ref >60)
Glucose, Bld: 110 mg/dL — ABNORMAL HIGH (ref 70–99)
Potassium: 3.6 mmol/L (ref 3.5–5.1)
Sodium: 138 mmol/L (ref 135–145)

## 2022-12-20 LAB — LIPASE, BLOOD: Lipase: 33 U/L (ref 11–51)

## 2022-12-20 LAB — MAGNESIUM: Magnesium: 2.2 mg/dL (ref 1.7–2.4)

## 2022-12-20 MED ORDER — ONDANSETRON HCL 4 MG/2ML IJ SOLN
4.0000 mg | Freq: Once | INTRAMUSCULAR | Status: AC
  Administered 2022-12-20: 4 mg via INTRAVENOUS
  Filled 2022-12-20: qty 2

## 2022-12-20 MED ORDER — IOHEXOL 350 MG/ML SOLN
100.0000 mL | Freq: Once | INTRAVENOUS | Status: AC | PRN
  Administered 2022-12-20: 75 mL via INTRAVENOUS

## 2022-12-20 MED ORDER — PANTOPRAZOLE SODIUM 20 MG PO TBEC: 20.0000 mg | DELAYED_RELEASE_TABLET | Freq: Every day | ORAL | 1 refills | Status: AC

## 2022-12-20 MED ORDER — MORPHINE SULFATE (PF) 2 MG/ML IV SOLN
2.0000 mg | Freq: Once | INTRAVENOUS | Status: AC
  Administered 2022-12-20: 2 mg via INTRAVENOUS
  Filled 2022-12-20: qty 1

## 2022-12-20 MED ORDER — LACTATED RINGERS IV BOLUS
1000.0000 mL | Freq: Once | INTRAVENOUS | Status: AC
  Administered 2022-12-20: 1000 mL via INTRAVENOUS

## 2022-12-20 MED ORDER — ONDANSETRON 4 MG PO TBDP: 4.0000 mg | ORAL_TABLET | Freq: Three times a day (TID) | ORAL | 0 refills | Status: AC | PRN

## 2022-12-20 NOTE — ED Notes (Signed)
Pt leaning over in pain, still complains of same mid epigastric pressure-type chest pain however states now pain descends about 4 inches down upper abdomen from chest. Blue top sent in case ordered. Messaged EDP.

## 2022-12-20 NOTE — ED Provider Notes (Signed)
[facility] Provider Note    Event Date/Time   First MD Initiated Contact with Patient 12/20/22 1609     (approximate)   History   Chief Complaint Chest Pain   HPI  Karina [name] is a 52 y.o. female with past medical history of interstitial cystitis, psoriatic arthritis, and uterine fibroids who presents to the ED complaining of chest/abdominal pain.  Patient reports that she has been dealing with constant pain in her epigastric area since eating dinner last night.  She describes the pain as a pressure that radiates towards both of her shoulder blades.  Pain is also exacerbated when she takes a deep breath, and pain will occasionally move up into her chest.  She denies any history of similar symptoms, has not had any recent fever or cough but states she was treated for COVID-19 a week and a half ago.  She reports feeling slightly short of breath, has not noticed any pain or swelling in her legs.  She has been feeling very nauseous but has not vomited, denies any changes in her bowel movements.  She is status post cholecystectomy.     Physical Exam   Triage Vital Signs: ED Triage Vitals  Enc Vitals Group     BP 12/20/22 1426 137/82     Pulse Rate 12/20/22 1426 (!) 120     Resp 12/20/22 1426 20     Temp 12/20/22 1426 98.1 F (36.7 C)     Temp Source 12/20/22 1426 Oral     SpO2 12/20/22 1426 96 %     Weight 12/20/22 1427 160 lb (72.6 kg)     Height 12/20/22 1427 '5\' 4"'$  (1.626 m)     Head Circumference --      Peak Flow --      Pain Score 12/20/22 1427 6     Pain Loc --      Pain Edu? --      Excl. in [place]? --     Most recent vital signs: Vitals:   12/20/22 1426 12/20/22 1853  BP: 137/82 106/67  Pulse: (!) 120 (!) 106  Resp: 20 16  Temp: 98.1 F (36.7 C) 99 F (37.2 C)  SpO2: 96% 98%    Constitutional: Alert and oriented. Eyes: Conjunctivae are normal. Head: Atraumatic. Nose: No congestion/rhinnorhea. Mouth/Throat: Mucous membranes are  moist.  Cardiovascular: Tachycardic, regular rhythm. Grossly normal heart sounds.  2+ radial pulses bilaterally. Respiratory: Normal respiratory effort.  No retractions. Lungs CTAB. Gastrointestinal: Soft and tender to palpation in her epigastrium and bilateral upper quadrants, greatest in the epigastrium with voluntary guarding. No distention. Musculoskeletal: No lower extremity tenderness nor edema.  Neurologic:  Normal speech and language. No gross focal neurologic deficits are appreciated.    ED Results / Procedures / Treatments   Labs (all labs ordered are listed, but only abnormal results are displayed) Labs Reviewed  BASIC METABOLIC PANEL - Abnormal; Notable for the following components:      Result Value   Glucose, Bld 110 (*)    All other components within normal limits  CBC - Abnormal; Notable for the following components:   Hemoglobin 15.1 (*)    All other components within normal limits  URINALYSIS, ROUTINE W REFLEX MICROSCOPIC - Abnormal; Notable for the following components:   Color, Urine YELLOW (*)    APPearance CLEAR (*)    All other components within normal limits  HEPATIC FUNCTION PANEL - Abnormal; Notable for the following components:   Total  Bilirubin 1.4 (*)    Indirect Bilirubin 1.2 (*)    All other components within normal limits  MAGNESIUM  LIPASE, BLOOD  PREGNANCY, URINE  TROPONIN I (HIGH SENSITIVITY)  TROPONIN I (HIGH SENSITIVITY)     EKG  ED ECG REPORT I, Blake Divine, the attending physician, personally viewed and interpreted this ECG.   Date: 12/20/2022  EKG Time: 14:24  Rate: 120  Rhythm: sinus tachycardia  Axis: Normal  Intervals: Prolonged QT  ST&T Change: None  RADIOLOGY Chest x-ray reviewed and interpreted by me with no infiltrate, edema, or effusion.  PROCEDURES:  Critical Care performed: No  Procedures   MEDICATIONS ORDERED IN ED: Medications  morphine (PF) 2 MG/ML injection 2 mg (2 mg Intravenous Given 12/20/22 1658)   ondansetron (ZOFRAN) injection 4 mg (4 mg Intravenous Given 12/20/22 1657)  lactated ringers bolus 1,000 mL (0 mLs Intravenous Stopped 12/20/22 1855)  iohexol (OMNIPAQUE) 350 MG/ML injection 100 mL (75 mLs Intravenous Contrast Given 12/20/22 1740)     IMPRESSION / MDM / ASSESSMENT AND PLAN / ED COURSE  I reviewed the triage vital signs and the nursing notes.                              52 y.o. female with past medical history of interstitial cystitis, psoriatic arthritis, and uterine fibroids who presents to the ED with pain in her epigastrium radiating towards her shoulder blades since eating dinner last night.  Patient's presentation is most consistent with acute presentation with potential threat to life or bodily function.  Differential diagnosis includes, but is not limited to, pancreatitis, hepatitis, GERD, gastritis, choledocholithiasis, ACS, PE, pneumonia, pneumothorax, dissection.  Patient uncomfortable appearing but in no acute distress, vital signs remarkable for tachycardia but otherwise reassuring.  Pain is primarily reproducible with palpation in her epigastrium, labs thus far are reassuring with no significant anemia, leukocytosis, lecture abnormality, or AKI but we will add on LFTs and lipase.  Low suspicion for cardiac etiology as EKG shows no ischemic changes and troponin within normal limits.  She does have sinus tachycardia and prolonged QT noted, will add on magnesium level, treat symptomatically with IV morphine and Zofran.  Urinalysis shows no signs of infection.  LFT's show mild elevation in bilirubin, although this is similar to previous in 2022 and likely not acute. Lipase is within normal limits and repeat troponin is negative, doubt ACS. CTA is negative for PE or other acute process, CT abdomen/pelvis remarkable only for enteritis in the area of the jejunum and possible developing ileus, no evidence of obstruction. Pain is much improved on reassessment and patient has  been able to tolerate crackers and water without difficulty. She is appropriate for outpatient management and will be prescribed PPI and zofran, also will refer to GI for follow-up. She was counseled to return to the ED for new or worsening symptoms, patient agrees with plan.      FINAL CLINICAL IMPRESSION(S) / ED DIAGNOSES   Final diagnoses:  Enteritis  Ileus ([place])     Rx / DC Orders   ED Discharge Orders          Ordered    pantoprazole (PROTONIX) 20 MG tablet  Daily        12/20/22 1907    ondansetron (ZOFRAN-ODT) 4 MG disintegrating tablet  Every 8 hours PRN        12/20/22 1907  Note:  This document was prepared using Dragon voice recognition software and may include unintentional dictation errors.   Blake Divine, MD 12/20/22 1911

## 2022-12-20 NOTE — ED Triage Notes (Signed)
Pt to ED for mid epigastric pain since last night after eating supper. Pain is pressure-like, 6/10. Pain radiates to upper back and both shoulder blades.   Since this AM pt has had dizziness with standing.  Pt had covid 1.5wk ago and took paxlovid.    EKG shown to EDP. Long QT on EKG. Pulse is 120.  Pt has hx interstitial cystitis, states has been feeling burning with urination for about 2 weeks and heaviness in hips.

## 2023-02-27 ENCOUNTER — Ambulatory Visit: Payer: 59 | Admitting: Urology

## 2023-03-04 ENCOUNTER — Ambulatory Visit (INDEPENDENT_AMBULATORY_CARE_PROVIDER_SITE_OTHER): Payer: 59 | Admitting: Urology

## 2023-03-04 VITALS — BP 127/78 | HR 101 | Ht 64.0 in | Wt 158.0 lb

## 2023-03-04 DIAGNOSIS — N3010 Interstitial cystitis (chronic) without hematuria: Secondary | ICD-10-CM | POA: Diagnosis not present

## 2023-03-04 DIAGNOSIS — N390 Urinary tract infection, site not specified: Secondary | ICD-10-CM

## 2023-03-04 MED ORDER — URIBEL 118 MG PO CAPS: 1.0000 | ORAL_CAPSULE | Freq: Three times a day (TID) | ORAL | 3 refills | Status: DC | PRN

## 2023-03-04 MED ORDER — TROSPIUM CHLORIDE 20 MG PO TABS: 20.0000 mg | ORAL_TABLET | Freq: Two times a day (BID) | ORAL | 0 refills | Status: AC | PRN

## 2023-03-04 MED ORDER — FLUCONAZOLE 100 MG PO TABS: 100.0000 mg | ORAL_TABLET | Freq: Every day | ORAL | 1 refills | Status: AC

## 2023-03-04 MED ORDER — CEPHALEXIN 500 MG PO CAPS: ORAL_CAPSULE | ORAL | 3 refills | Status: DC

## 2023-03-04 NOTE — Progress Notes (Signed)
I, DeAsia L Maxie,acting as a scribe for Riki Altes, MD.,have documented all relevant documentation on the behalf of Riki Altes, MD,as directed by  Riki Altes, MD while in the presence of Riki Altes, MD.   03/04/23 3:55 PM   Elberta Spaniel [birth year] [id number]  Referring provider: Kandyce Rud, MD [phone] [address] [facility] [place],  [place] [address]  Chief Complaint  Patient presents with   Follow-up    Urologic history: 1.  Interstitial cystitis -Diagnosed by Dr. Achilles Dunk -managed with Ninfa Linden and Myrbetriq as needed -Saw [name] in 2022 with symptom flare and underwent weekly rescue intravesical treatments x7   2.  Recurrent UTI -Demand therapy with cephalexin -Yeast vaginitis with antibiotic therapy   3.  Stress urinary incontinence   HPI: 52 y.o. female presents for annual follow-up.  Stable symptoms since her last visit She reports intermittent flares of urinary frequency, urgency, and dysuria, along with occasional low back and lower abdominal discomfort.  Myrbetriq was not covered by her insurance Continues Uribel as needed for symptom flares Remains on demand therapy with Keflex Seen in the ED January 2024 for epigastric pain and a CT scan was performed which showed no GU abnormalities   PMH: Past Medical History:  Diagnosis Date   Allergic genetic state    Chronic constipation    Colon polyp    Complication of anesthesia    facial swelling after 1 bladder procedure   Fibroid uterus    History of cervical dysplasia    IBS (irritable bowel syndrome)    Interstitial cystitis    PONV (postoperative nausea and vomiting)    Psoriasis    Psoriatic arthritis (HCC)     Surgical History: Past Surgical History:  Procedure Laterality Date   bladder hydrodistentions     BLADDER SURGERY     CATARACT EXTRACTION W/PHACO Right 08/03/2019   Procedure: CATARACT EXTRACTION PHACO AND INTRAOCULAR LENS  PLACEMENT (IOC)  RIGHT TORIC LENS  00:21.4  13.7%  2.94;  Surgeon: Lockie Mola, MD;  Location: [facility];  Service: Ophthalmology;  Laterality: Right;   CATARACT EXTRACTION W/PHACO Left 08/24/2019   Procedure: CATARACT EXTRACTION PHACO AND INTRAOCULAR LENS PLACEMENT (IOC) LEFT TORIC LENS  0:20 13.6% 2.82;  Surgeon: Lockie Mola, MD;  Location: [facility];  Service: Ophthalmology;  Laterality: Left;   CHOLECYSTECTOMY     COLONOSCOPY     COLONOSCOPY WITH PROPOFOL N/A 03/16/2020   Procedure: COLONOSCOPY WITH PROPOFOL;  Surgeon: Earline Mayotte, MD;  Location: [facility];  Service: Endoscopy;  Laterality: N/A;   ESOPHAGOGASTRODUODENOSCOPY     ESOPHAGOGASTRODUODENOSCOPY (EGD) WITH PROPOFOL N/A 03/16/2020   Procedure: ESOPHAGOGASTRODUODENOSCOPY (EGD) WITH PROPOFOL;  Surgeon: Earline Mayotte, MD;  Location: [facility];  Service: Endoscopy;  Laterality: N/A;   FLEXIBLE SIGMOIDOSCOPY     HYSTEROSCOPY WITH D & C N/A 04/04/2022   Procedure: DILATATION AND CURETTAGE /HYSTEROSCOPY/MYOSURE;  Surgeon: Maxie Better, MD;  Location: [facility];  Service: Gynecology;  Laterality: N/A;   LIPOSUCTION TRUNK     TONSILLECTOMY      Home Medications:  Allergies as of 03/04/2023       Reactions   Macrobid [nitrofurantoin Macrocrystal] Nausea And Vomiting, Rash   Tdap [tetanus-diphth-acell Pertussis] Swelling, Rash        Medication List        Accurate as of March 04, 2023  3:55 PM. If you have any questions, ask your  nurse or doctor.          STOP taking these medications    mirabegron ER 50 MG Tb24 tablet Commonly known as: MYRBETRIQ       TAKE these medications    cephALEXin 500 MG capsule Commonly known as: Keflex 1 capsule 3 times daily x 5-7 days at onset of UTI symptoms   CHEWABLES MULTIVITAMIN PO Take by mouth.   famotidine 20 MG tablet Commonly known as: PEPCID Take by mouth.   fluconazole 100 MG  tablet Commonly known as: Diflucan Take 1 tablet (100 mg total) by mouth daily.   ibuprofen 800 MG tablet Commonly known as: ADVIL Take 1 tablet (800 mg total) by mouth every 8 (eight) hours as needed for mild pain or moderate pain.   ondansetron 4 MG disintegrating tablet Commonly known as: ZOFRAN-ODT Take 1 tablet (4 mg total) by mouth every 8 (eight) hours as needed for nausea or vomiting.   pantoprazole 20 MG tablet Commonly known as: Protonix Take 1 tablet (20 mg total) by mouth daily.   Paxlovid (300/100) 20 x 150 MG & 10 x 100MG  Tbpk Generic drug: nirmatrelvir & ritonavir SMARTSIG:By Mouth Morning-Evening   prochlorperazine 10 MG tablet Commonly known as: COMPAZINE Take 1 tablet (10 mg total) by mouth every 6 (six) hours as needed for nausea or vomiting.   SYSTANE OP Apply to eye.   trospium 20 MG tablet Commonly known as: SANCTURA Take 1 tablet (20 mg total) by mouth 2 (two) times daily as needed.   Uribel 118 MG Caps Take 1 capsule (118 mg total) by mouth 3 (three) times daily as needed (Urinary frequency, urgency, burning).   Xiidra 5 % Soln Generic drug: Lifitegrast        Allergies:  Allergies  Allergen Reactions   Macrobid [Nitrofurantoin Macrocrystal] Nausea And Vomiting and Rash   Tdap [Tetanus-Diphth-Acell Pertussis] Swelling and Rash    Family History: Family History  Problem Relation Age of Onset   Ulcers Mother    Hypertension Father    Prostate cancer Father    Colon polyps Father    Hypertension Brother    Colon polyps Brother    Clotting disorder Brother    Diabetes Paternal Grandfather    Stroke Maternal Grandmother    Migraines Neg Hx     Social History:  reports that she has never smoked. She has never used smokeless tobacco. She reports that she does not drink alcohol and does not use drugs.   Physical Exam: BP 127/78   Pulse (!) 101   Ht 5\' 4"  (1.626 m)   Wt 158 lb (71.7 kg)   BMI 27.12 kg/m   Constitutional:  Alert  and oriented, No acute distress. HEENT: [place] AT Respiratory: Normal respiratory effort, no increased work of breathing. Psychiatric: Normal mood and affect.  Laboratory Data:  Urinalysis Dipstick/microscopy negative  Assessment & Plan:    1.  Chronic interstitial cystitis Uribel refilled Trial Trospium BID prn  2.  Recurrent UTI Cephalexin refilled Follow-up as needed  I have reviewed the above documentation for accuracy and completeness, and I agree with the above.   Riki Altes, MD  [facility] [address], [address] [place], [place] [address] [phone]

## 2023-03-05 ENCOUNTER — Encounter: Payer: Self-pay | Admitting: Urology

## 2023-03-05 LAB — URINALYSIS, COMPLETE
Bilirubin, UA: NEGATIVE
Glucose, UA: NEGATIVE
Ketones, UA: NEGATIVE
Leukocytes,UA: NEGATIVE
Nitrite, UA: NEGATIVE
Protein,UA: NEGATIVE
Specific Gravity, UA: 1.02 (ref 1.005–1.030)
Urobilinogen, Ur: 0.2 mg/dL (ref 0.2–1.0)
pH, UA: 6 (ref 5.0–7.5)

## 2023-03-05 LAB — MICROSCOPIC EXAMINATION: Bacteria, UA: NONE SEEN

## 2023-03-23 ENCOUNTER — Telehealth: Payer: Self-pay | Admitting: Urology

## 2023-03-23 NOTE — Telephone Encounter (Signed)
Notified patient Uribel can't have a prior auth done . She understands

## 2023-03-23 NOTE — Telephone Encounter (Signed)
Patient called and stated that pharmacy informed her that a prior authorization is needed for Uribel 11 mg caps; or if there is an alternative medication that can be prescribed. Pharmacy is [facility]. Please let patient know.

## 2023-04-18 IMAGING — MR MR HEAD W/O CM
12 series · 48 of 48 positions shown · non-contrast
Comparison: Head CT same day

CLINICAL DATA: Neurological deficit, acute, stroke suspected.
Left-sided arm and leg numbness beginning today.

EXAM:
MRI HEAD WITHOUT CONTRAST
TECHNIQUE: Multiplanar, multiecho pulse sequences of the brain and surrounding
structures were obtained without intravenous contrast.

[Series 5: ax dwi_tracew · axial · 3.0mm · 0.65mm/px · z∈[-133,+21]mm · 3 of 48 slices shown]
[im 1/48]
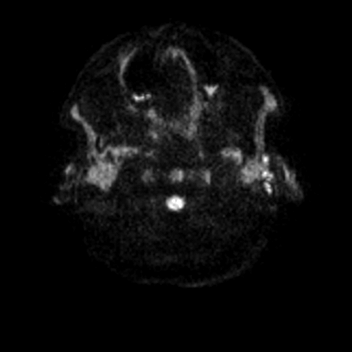
[im 24/48]
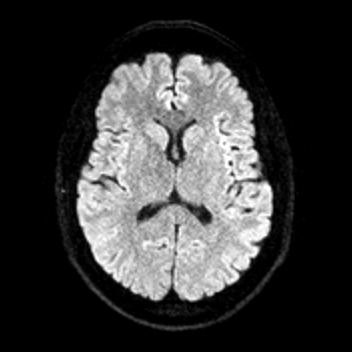
[im 48/48]
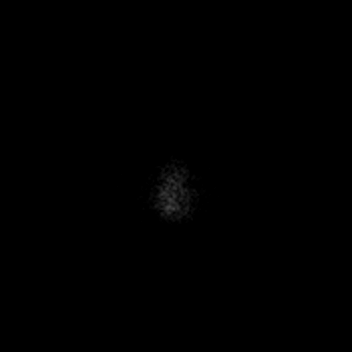

[Series 6: ax dwi_adc · axial · 3.0mm · 0.65mm/px · z∈[-133,+21]mm · 4 of 48 slices shown]
[im 1/48]
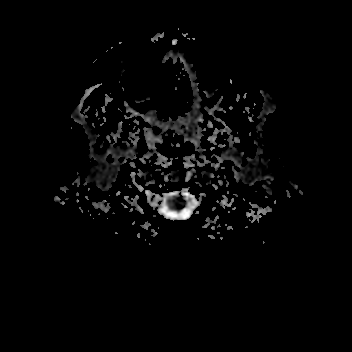
[im 16/48]
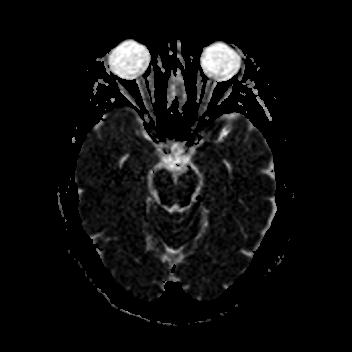
[im 32/48]
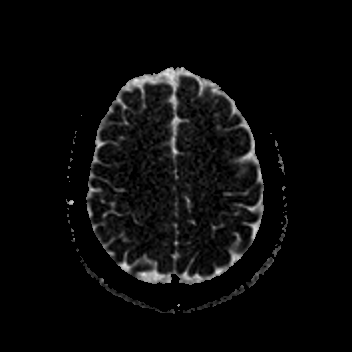
[im 48/48]
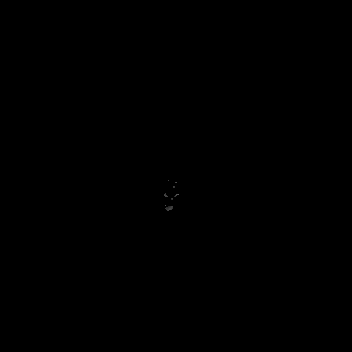

[Series 7: cor dwi_tracew · coronal · 5.0mm · 0.60mm/px · 3 of 38 slices shown]
[im 1/38]
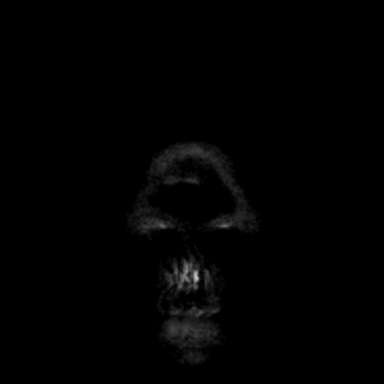
[im 19/38]
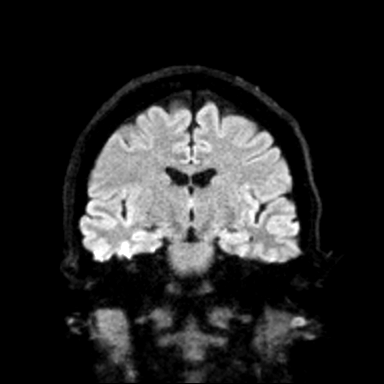
[im 38/38]
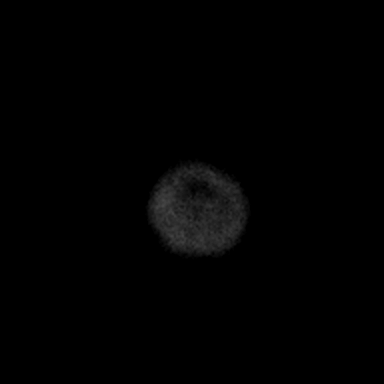

[Series 8: cor dwi_adc · coronal · 5.0mm · 0.60mm/px · 3 of 38 slices shown]
[im 1/38]
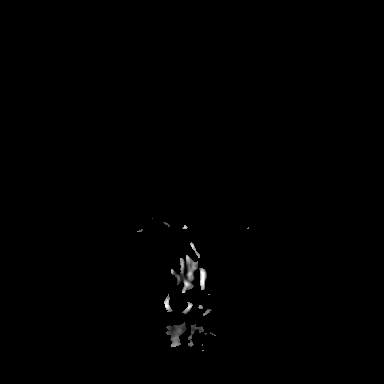
[im 19/38]
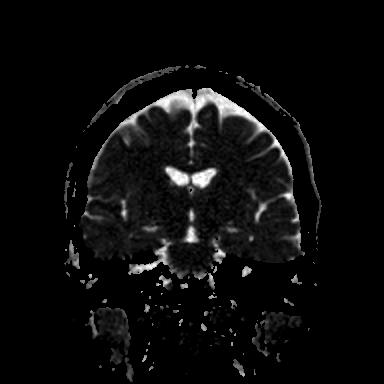
[im 38/38]
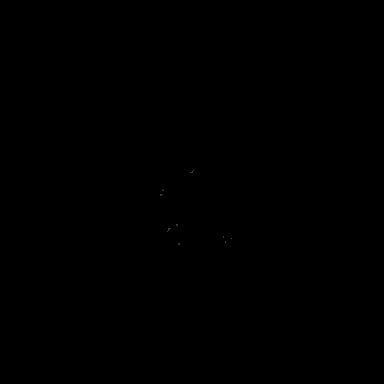

[Series 9: T1 · sagittal · 5.0mm · 0.62mm/px · 2 of 21 slices shown (1 of 2)]
[im 1/21]
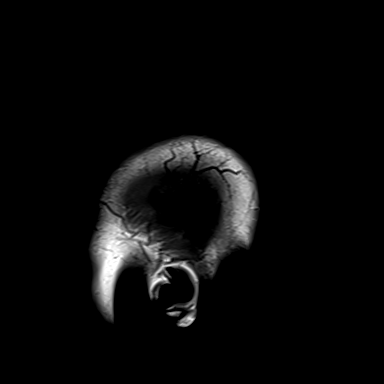
[im 21/21]
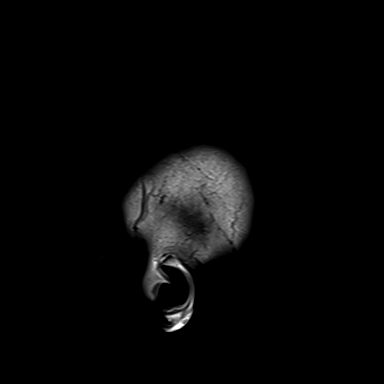

[Series 10: T2 · axial · 5.0mm · 0.69mm/px · z∈[-132,+22]mm · 2 of 27 slices shown (1 of 2)]
[im 1/27]
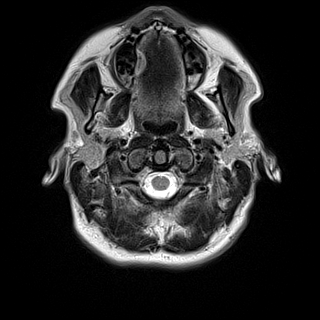
[im 27/27]
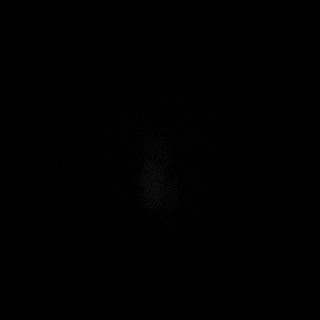

[Series 11: mag_images · axial · 3.0mm · 0.90mm/px · z∈[-143,+32]mm · 4 of 60 slices shown]
[im 1/60]
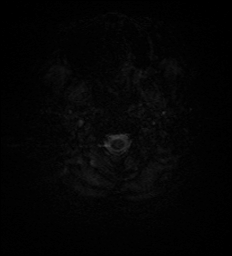
[im 20/60]
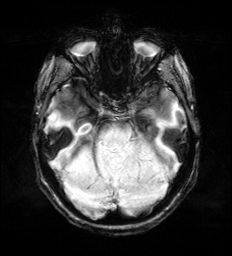
[im 40/60]
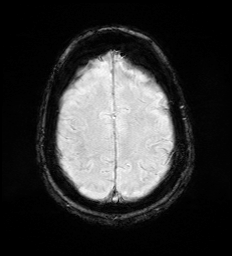
[im 60/60]
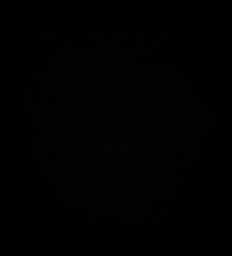

[Series 13: swi_images · axial · 3.0mm · 0.90mm/px · z∈[-143,+32]mm · 4 of 60 slices shown]
[im 1/60]
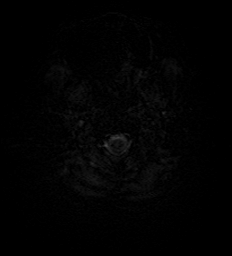
[im 20/60]
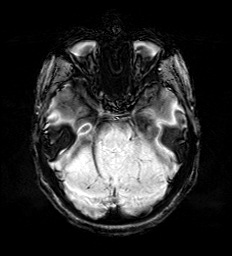
[im 40/60]
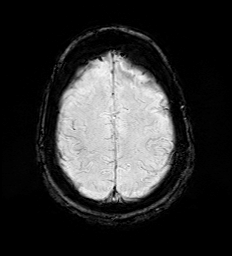
[im 60/60]
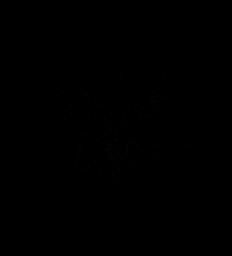

[Series 14: mip_images(sw) · axial · 24.0mm · 0.90mm/px · z∈[-133,+21]mm · 4 of 53 slices shown]
[im 1/53]
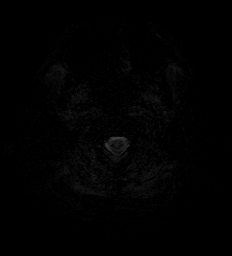
[im 18/53]
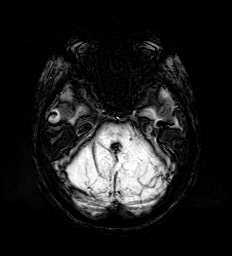
[im 35/53]
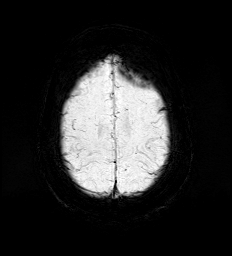
[im 53/53]
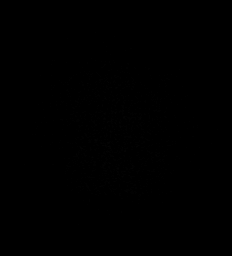

[Series 15: FLAIR · axial · 3.0mm · 0.53mm/px · z∈[-135,+25]mm · 4 of 55 slices shown]
[im 1/55]
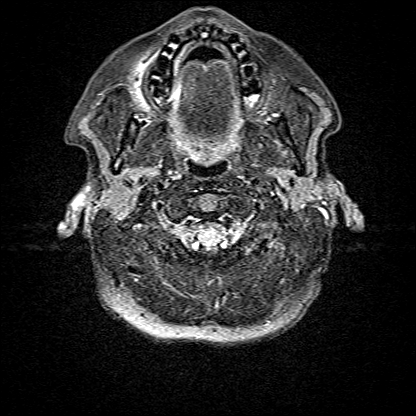
[im 19/55]
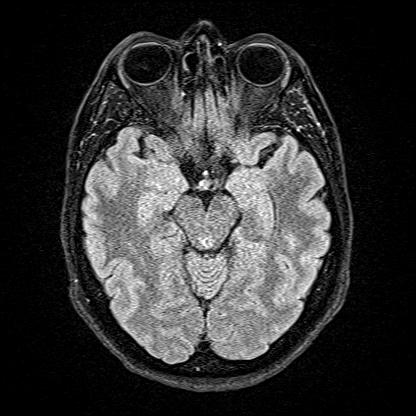
[im 37/55]
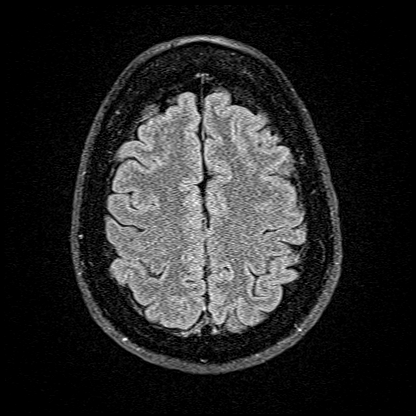
[im 55/55]
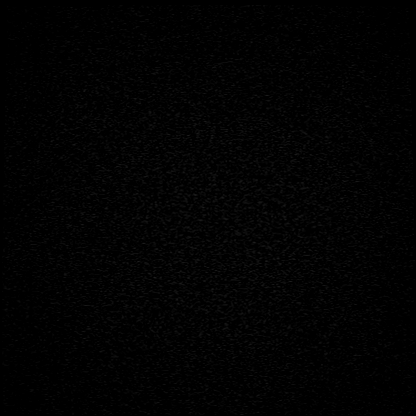

[Series 16: T1 · axial · 1.0mm · 0.98mm/px · z∈[-144,+28]mm · 13 of 175 slices shown (2 of 2)]
[im 1/175]
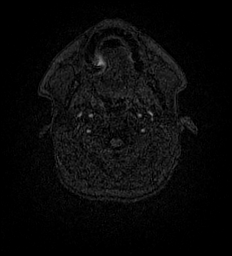
[im 15/175]
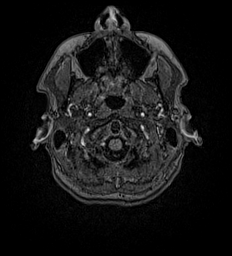
[im 30/175]
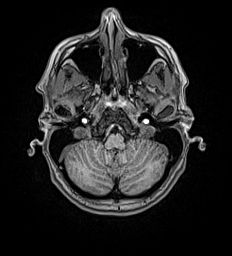
[im 44/175]
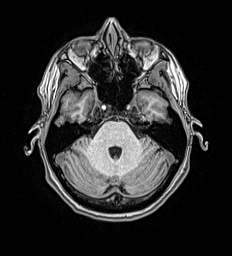
[im 59/175]
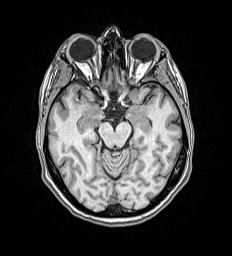
[im 73/175]
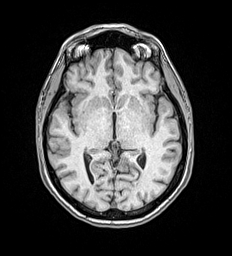
[im 88/175]
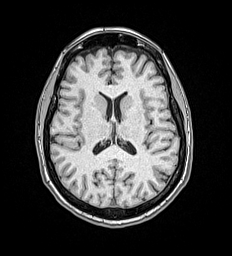
[im 102/175]
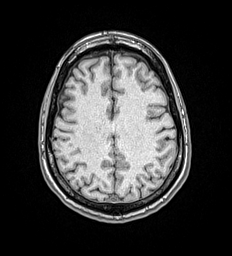
[im 117/175]
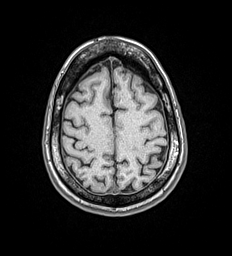
[im 131/175]
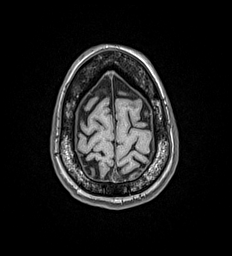
[im 146/175]
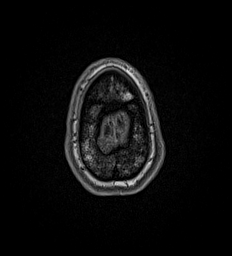
[im 160/175]
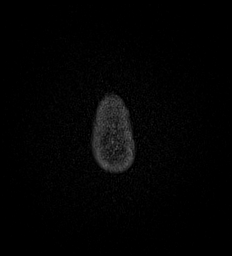
[im 175/175]
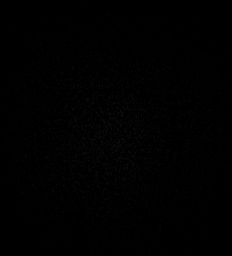

[Series 17: T2 · coronal · 5.0mm · 0.57mm/px · 2 of 29 slices shown (2 of 2)]
[im 1/29]
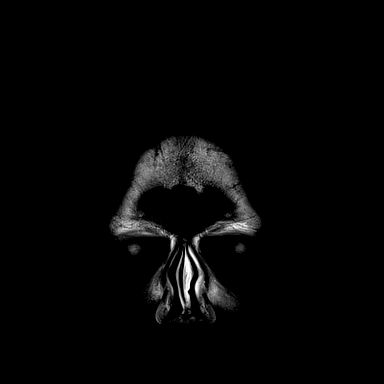
[im 29/29]
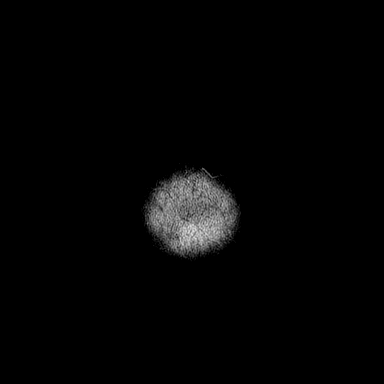

[48 of 48 positions shown; findings below may reference images not displayed]

FINDINGS: Brain: The brain has a normal appearance without evidence of
malformation, atrophy, old or acute small or large vessel
infarction, mass lesion, hemorrhage, hydrocephalus or extra-axial
collection.

Vascular: Major vessels at the base of the brain show flow. Venous
sinuses appear patent.

Skull and upper cervical spine: Normal.

Sinuses/Orbits: Clear/normal.

Other: None significant.
IMPRESSION: Normal examination. No abnormality seen to explain the clinical
presentation.

## 2023-05-13 ENCOUNTER — Other Ambulatory Visit: Payer: Self-pay | Admitting: Family Medicine

## 2023-05-13 DIAGNOSIS — Z1231 Encounter for screening mammogram for malignant neoplasm of breast: Secondary | ICD-10-CM

## 2023-06-29 ENCOUNTER — Ambulatory Visit: Admission: RE | Admit: 2023-06-29 | Payer: 59 | Source: Ambulatory Visit

## 2023-06-29 DIAGNOSIS — Z1231 Encounter for screening mammogram for malignant neoplasm of breast: Secondary | ICD-10-CM

## 2023-07-30 ENCOUNTER — Other Ambulatory Visit
Admission: RE | Admit: 2023-07-30 | Discharge: 2023-07-30 | Disposition: A | Discharge status: Home / Self Care | Payer: 59 | Source: Ambulatory Visit | Attending: Oculoplastics Ophthalmology | Admitting: Oculoplastics Ophthalmology

## 2023-07-30 DIAGNOSIS — M3500 Sicca syndrome, unspecified: Secondary | ICD-10-CM | POA: Insufficient documentation

## 2023-07-31 LAB — SJOGRENS SYNDROME-A EXTRACTABLE NUCLEAR ANTIBODY: SSA (Ro) (ENA) Antibody, IgG: <0.2 AI (ref 0.0–0.9)

## 2023-07-31 LAB — SJOGRENS SYNDROME-B EXTRACTABLE NUCLEAR ANTIBODY: SSB (La) (ENA) Antibody, IgG: <0.2 AI (ref 0.0–0.9)

## 2024-03-03 ENCOUNTER — Ambulatory Visit (INDEPENDENT_AMBULATORY_CARE_PROVIDER_SITE_OTHER): Payer: 59 | Admitting: Urology

## 2024-03-03 ENCOUNTER — Encounter: Payer: Self-pay | Admitting: Urology

## 2024-03-03 VITALS — BP 139/70 | HR 92

## 2024-03-03 DIAGNOSIS — N3010 Interstitial cystitis (chronic) without hematuria: Secondary | ICD-10-CM | POA: Diagnosis not present

## 2024-03-03 DIAGNOSIS — N390 Urinary tract infection, site not specified: Secondary | ICD-10-CM

## 2024-03-03 DIAGNOSIS — Z87440 Personal history of urinary (tract) infections: Secondary | ICD-10-CM

## 2024-03-03 LAB — URINALYSIS, COMPLETE
Bilirubin, UA: NEGATIVE
Glucose, UA: NEGATIVE
Ketones, UA: NEGATIVE
Leukocytes,UA: NEGATIVE
Nitrite, UA: NEGATIVE
Protein,UA: NEGATIVE
RBC, UA: NEGATIVE
Specific Gravity, UA: <1.005 — ABNORMAL LOW (ref 1.005–1.030)
Urobilinogen, Ur: 0.2 mg/dL (ref 0.2–1.0)
pH, UA: 6 (ref 5.0–7.5)

## 2024-03-03 LAB — MICROSCOPIC EXAMINATION
Bacteria, UA: NONE SEEN
RBC, Urine: NONE SEEN /HPF (ref 0–2)

## 2024-03-03 MED ORDER — URIBEL 118 MG PO CAPS: 1.0000 | ORAL_CAPSULE | Freq: Three times a day (TID) | ORAL | 3 refills | Status: AC | PRN

## 2024-03-03 MED ORDER — CEPHALEXIN 500 MG PO CAPS: ORAL_CAPSULE | ORAL | 3 refills | Status: AC

## 2024-03-03 NOTE — Progress Notes (Signed)
 I, Maysun Anabel Bene, acting as a scribe for Riki Altes, MD., have documented all relevant documentation on the behalf of Riki Altes, MD, as directed by Riki Altes, MD while in the presence of Riki Altes, MD.  03/03/2024 11:02 PM   Elberta Spaniel 1971/03/11 [id number]  Referring provider: Kandyce Rud, MD [phone] [address] [facility] [place],  [place] [address]  Chief Complaint  Patient presents with   Follow-up    IC (interstitial cystitis)   Urologic history: 1.  Interstitial cystitis Diagnosed by Dr. Achilles Dunk managed with Ninfa Linden and Myrbetriq as needed Saw [name] in 2022 with symptom flare and underwent weekly rescue intravesical treatments x7   2.  Recurrent UTI Demand therapy with cephalexin Yeast vaginitis with antibiotic therapy   3.  Stress urinary incontinence  HPI: Karina [name] is a 53 y.o. female presents for annual follow-up.  For the past 1 month she has had symptom flare with complaints of lumbosacral and pelvic pain with increased frequency, urgency, comodity, hesitancy, and weak urinary stream. She presently is taking Uribel. She has taken amitriptyline in the past and has not been able to tolerate.  Her voiding symptoms are not bothersome as she works from home.  Denies gross hematuria.   PMH: Past Medical History:  Diagnosis Date   Allergic genetic state    Chronic constipation    Colon polyp    Complication of anesthesia    facial swelling after 1 bladder procedure   Fibroid uterus    History of cervical dysplasia    IBS (irritable bowel syndrome)    Interstitial cystitis    PONV (postoperative nausea and vomiting)    Psoriasis    Psoriatic arthritis (HCC)     Surgical History: Past Surgical History:  Procedure Laterality Date   bladder hydrodistentions     BLADDER SURGERY     CATARACT EXTRACTION W/PHACO Right 08/03/2019   Procedure: CATARACT EXTRACTION PHACO AND INTRAOCULAR  LENS PLACEMENT (IOC)  RIGHT TORIC LENS  00:21.4  13.7%  2.94;  Surgeon: Lockie Mola, MD;  Location: [facility];  Service: Ophthalmology;  Laterality: Right;   CATARACT EXTRACTION W/PHACO Left 08/24/2019   Procedure: CATARACT EXTRACTION PHACO AND INTRAOCULAR LENS PLACEMENT (IOC) LEFT TORIC LENS  0:20 13.6% 2.82;  Surgeon: Lockie Mola, MD;  Location: [facility];  Service: Ophthalmology;  Laterality: Left;   CHOLECYSTECTOMY     COLONOSCOPY     COLONOSCOPY WITH PROPOFOL N/A 03/16/2020   Procedure: COLONOSCOPY WITH PROPOFOL;  Surgeon: Earline Mayotte, MD;  Location: [facility];  Service: Endoscopy;  Laterality: N/A;   ESOPHAGOGASTRODUODENOSCOPY     ESOPHAGOGASTRODUODENOSCOPY (EGD) WITH PROPOFOL N/A 03/16/2020   Procedure: ESOPHAGOGASTRODUODENOSCOPY (EGD) WITH PROPOFOL;  Surgeon: Earline Mayotte, MD;  Location: [facility];  Service: Endoscopy;  Laterality: N/A;   FLEXIBLE SIGMOIDOSCOPY     HYSTEROSCOPY WITH D & C N/A 04/04/2022   Procedure: DILATATION AND CURETTAGE /HYSTEROSCOPY/MYOSURE;  Surgeon: Maxie Better, MD;  Location:  [facility];  Service: Gynecology;  Laterality: N/A;   LIPOSUCTION TRUNK     TONSILLECTOMY      Home Medications:  Allergies as of 03/03/2024       Reactions   Macrobid [nitrofurantoin Macrocrystal] Nausea And Vomiting, Rash   Tdap [tetanus-diphth-acell Pertussis] Swelling, Rash        Medication List        Accurate as of March 03, 2024 11:02 PM. If  you have any questions, ask your nurse or doctor.          cephALEXin 500 MG capsule Commonly known as: KEFLEX 1 capsule 3 times daily x 5-7 days at onset of UTI symptoms   CHEWABLES MULTIVITAMIN PO Take by mouth.   famotidine 20 MG tablet Commonly known as: PEPCID Take by mouth.   fluconazole 100 MG tablet Commonly known as: Diflucan Take 1 tablet (100 mg total) by mouth daily.   ibuprofen 800 MG tablet Commonly known as: ADVIL Take 1  tablet (800 mg total) by mouth every 8 (eight) hours as needed for mild pain or moderate pain.   Multi-Vitamin tablet Take by mouth.   ondansetron 4 MG disintegrating tablet Commonly known as: ZOFRAN-ODT Take 1 tablet (4 mg total) by mouth every 8 (eight) hours as needed for nausea or vomiting.   pantoprazole 20 MG tablet Commonly known as: Protonix Take 1 tablet (20 mg total) by mouth daily.   Paxlovid (300/100) 20 x 150 MG & 10 x 100MG  Tbpk Generic drug: nirmatrelvir/ritonavir SMARTSIG:By Mouth Morning-Evening   prochlorperazine 10 MG tablet Commonly known as: COMPAZINE Take 1 tablet (10 mg total) by mouth every 6 (six) hours as needed for nausea or vomiting.   promethazine-dextromethorphan 6.25-15 MG/5ML syrup Commonly known as: PROMETHAZINE-DM Take by mouth.   SYSTANE OP Apply to eye.   trospium 20 MG tablet Commonly known as: SANCTURA Take 1 tablet (20 mg total) by mouth 2 (two) times daily as needed.   Uribel 118 MG Caps Take 1 capsule (118 mg total) by mouth 3 (three) times daily as needed (Urinary frequency, urgency, burning).   venlafaxine XR 37.5 MG 24 hr capsule Commonly known as: EFFEXOR-XR   Xiidra 5 % Soln Generic drug: Lifitegrast        Allergies:  Allergies  Allergen Reactions   Macrobid [Nitrofurantoin Macrocrystal] Nausea And Vomiting and Rash   Tdap [Tetanus-Diphth-Acell Pertussis] Swelling and Rash    Family History: Family History  Problem Relation Age of Onset   Ulcers Mother    Hypertension Father    Prostate cancer Father    Colon polyps Father    Stroke Maternal Grandmother    Diabetes Paternal Grandfather    Hypertension Brother    Colon polyps Brother    Clotting disorder Brother    Migraines Neg Hx    Breast cancer Neg Hx     Social History:  reports that she has never smoked. She has never used smokeless tobacco. She reports that she does not drink alcohol and does not use drugs.   Physical Exam: BP 139/70   Pulse  92   Constitutional:  Alert and oriented, No acute distress. HEENT: [place] AT Respiratory: Normal respiratory effort, no increased work of breathing. Psychiatric: Normal mood and affect.  Urinalysis Dipstick/microscopy negative    Assessment & Plan:    1. Interstitial cystitis Currently experiencing a symptom flare She has previously undergone rescue treatment, however this did not significantly change her symptoms.  Discussed use of a sedating antihistamine at bedtime and a non-sedating histamine during the day, which may improve her symptoms.  If her pain does not significantly improve within the next 1-2 weeks, she will call back and will obtain a CT abdomen/pelvis.  2. Recurrent UTIs She has been on demand therapy with Cefalexin and has not required the medication in the last year. She was given a paper Rx as her current antibiotic has expired.  I have reviewed the above documentation for  accuracy and completeness, and I agree with the above.   Riki Altes, MD  [facility] [address], [address] [place], [place] [address] [phone]

## 2024-10-25 ENCOUNTER — Ambulatory Visit
Admission: EM | Admit: 2024-10-25 | Discharge: 2024-10-25 | Disposition: A | Discharge status: Home / Self Care | Attending: Emergency Medicine | Admitting: Emergency Medicine

## 2024-10-25 ENCOUNTER — Encounter: Payer: Self-pay | Admitting: Emergency Medicine

## 2024-10-25 DIAGNOSIS — J0140 Acute pansinusitis, unspecified: Secondary | ICD-10-CM | POA: Diagnosis not present

## 2024-10-25 MED ORDER — BENZONATATE 100 MG PO CAPS: 100.0000 mg | ORAL_CAPSULE | Freq: Three times a day (TID) | ORAL | 0 refills | Status: AC

## 2024-10-25 MED ORDER — PREDNISONE 10 MG (21) PO TBPK: ORAL_TABLET | Freq: Every day | ORAL | 0 refills | Status: DC

## 2024-10-25 MED ORDER — PROMETHAZINE-DM 6.25-15 MG/5ML PO SYRP: 5.0000 mL | ORAL_SOLUTION | Freq: Every evening | ORAL | 0 refills | Status: DC | PRN

## 2024-10-25 MED ORDER — AMOXICILLIN-POT CLAVULANATE 875-125 MG PO TABS: 1.0000 | ORAL_TABLET | Freq: Two times a day (BID) | ORAL | 0 refills | Status: DC

## 2024-10-25 NOTE — ED Triage Notes (Signed)
 Patient reports bilateral ear pain, cough yellow mucus , nasal congestion and chest congestion x 2 weeks. Patient muicnex dm with mild relief. Rates ear pain 5/10.

## 2024-10-25 NOTE — Discharge Instructions (Signed)
 Today being treated for sinus infection and as her symptoms have persisted for 2 weeks she will be started on antibiotics  Take Augmentin  twice daily for 7 days for coverage for bacteria  Starting tomorrow take prednisone every morning with food to help reduce sinus pressure, avoid ibuprofen  during treatment but may use Tylenol   You may use Tessalon pill every 8 hours as needed for cough, may use cough syrup at bedtime to allow for rest  You can take Tylenol  as needed for fever reduction and pain relief.   For cough: honey 1/2 to 1 teaspoon (you can dilute the honey in water or another fluid).  You can also use guaifenesin and dextromethorphan for cough. You can use a humidifier for chest congestion and cough.  If you don't have a humidifier, you can sit in the bathroom with the hot shower running.      For sore throat: try warm salt water gargles, cepacol lozenges, throat spray, warm tea or water with lemon/honey, popsicles or ice, or OTC cold relief medicine for throat discomfort.   For congestion: take a daily anti-histamine like Zyrtec , Claritin, and a oral decongestant, such as pseudoephedrine.  You can also use Flonase  1-2 sprays in each nostril daily.   It is important to stay hydrated: drink plenty of fluids (water, gatorade/powerade/pedialyte, juices, or teas) to keep your throat moisturized and help further relieve irritation/discomfort.

## 2024-10-25 NOTE — ED Provider Notes (Signed)
 [facility]    CSN: [id number] Arrival date & time: 10/25/24  1645      History   Chief Complaint Chief Complaint  Patient presents with   Cough   Otalgia   Nasal Congestion    HPI Karina [name] is a 53 y.o. female.   Patient presents for evaluation of nasal congestion, bilateral ear pain, sinus pressure to the forehead and into the bilateral cheeks, a nonproductive cough and diarrhea beginning 2 weeks ago.  Relates the diarrhea to medication use.  Has seen thick yellow mucus from the nose but unable to expel from coughing.  Had nausea without vomiting today.  Appetite is decreased but tolerable to food and liquids.  Known sick contacts prior with ear infections.  Has attempted use of Coricidin and Mucinex DM.  Past Medical History:  Diagnosis Date   Allergic genetic state    Chronic constipation    Colon polyp    Complication of anesthesia    facial swelling after 1 bladder procedure   Fibroid uterus    History of cervical dysplasia    IBS (irritable bowel syndrome)    Interstitial cystitis    PONV (postoperative nausea and vomiting)    Psoriasis    Psoriatic arthritis (HCC)     Patient Active Problem List   Diagnosis Date Noted   Recurrent UTI 05/27/2020   IC (interstitial cystitis) 08/05/2018   Dysuria 08/05/2018   Incomplete bladder emptying 08/05/2018   Occipital headache 09/23/2017   Encounter for long-term (current) use of high-risk medication 12/26/2015   Family history of psoriasis 12/26/2015   Bilateral cataracts 04/26/2015   Gastroesophageal reflux disease without esophagitis 04/26/2015   History of prediabetes 04/26/2015   History of vitamin D deficiency 04/26/2015   Psoriatic arthritis (HCC) 04/26/2015   Female stress incontinence 05/30/2013   Gonadoblastoma, 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   Increased frequency of urination 05/30/2013   Other specified symptom associated with female genital organs 05/30/2013   Other symptoms and signs involving the  genitourinary system 05/30/2013   Uterine spasm 05/30/2013    Past Surgical History:  Procedure Laterality Date   bladder hydrodistentions     BLADDER SURGERY     CATARACT EXTRACTION W/PHACO Right 08/03/2019   Procedure: CATARACT EXTRACTION PHACO AND INTRAOCULAR LENS PLACEMENT (IOC)  RIGHT TORIC LENS  00:21.4  13.7%  2.94;  Surgeon: Mittie Gaskin, MD;  Location: [facility];  Service: Ophthalmology;  Laterality: Right;   CATARACT EXTRACTION W/PHACO Left 08/24/2019   Procedure: CATARACT EXTRACTION PHACO AND INTRAOCULAR LENS PLACEMENT (IOC) LEFT TORIC LENS  0:20 13.6% 2.82;  Surgeon: Mittie Gaskin, MD;  Location: [facility];  Service: Ophthalmology;  Laterality: Left;   CHOLECYSTECTOMY     COLONOSCOPY     COLONOSCOPY WITH PROPOFOL  N/A 03/16/2020   Procedure: COLONOSCOPY WITH PROPOFOL ;  Surgeon: Dessa Reyes ORN, MD;  Location: [facility];  Service: Endoscopy;  Laterality: N/A;   ESOPHAGOGASTRODUODENOSCOPY     ESOPHAGOGASTRODUODENOSCOPY (EGD) WITH PROPOFOL  N/A 03/16/2020   Procedure: ESOPHAGOGASTRODUODENOSCOPY (EGD) WITH PROPOFOL ;  Surgeon: Dessa Reyes ORN, MD;  Location: [facility];  Service: Endoscopy;  Laterality: N/A;   FLEXIBLE SIGMOIDOSCOPY     HYSTEROSCOPY WITH D & C N/A 04/04/2022   Procedure: DILATATION AND CURETTAGE /HYSTEROSCOPY/MYOSURE;  Surgeon: Rutherford Gain, MD;  Location: [facility];  Service: Gynecology;  Laterality: N/A;   LIPOSUCTION TRUNK     TONSILLECTOMY      OB History   No obstetric history on file.  Home Medications    Prior to Admission medications   Medication Sig Start Date End Date Taking? Authorizing Provider  cephALEXin  (KEFLEX ) 500 MG capsule 1 capsule 3 times daily x 5-7 days at onset of UTI symptoms 03/03/24   Twylla Glendia BROCKS, MD  famotidine (PEPCID) 20 MG tablet Take by mouth.    [provider]  fluconazole  (DIFLUCAN ) 100 MG tablet Take 1 tablet (100 mg total) by mouth daily.  03/04/23   Stoioff, Glendia BROCKS, MD  ibuprofen  (ADVIL ) 800 MG tablet Take 1 tablet (800 mg total) by mouth every 8 (eight) hours as needed for mild pain or moderate pain. 04/04/22   Rutherford Gain, MD  Meth-Hyo-M Starlene Phos-Ph Sal (URIBEL ) 118 MG CAPS Take 1 capsule (118 mg total) by mouth 3 (three) times daily as needed (Urinary frequency, urgency, burning). 03/03/24   Stoioff, Glendia BROCKS, MD  Multiple Vitamin (MULTI-VITAMIN) tablet Take by mouth.    [provider]  ondansetron  (ZOFRAN -ODT) 4 MG disintegrating tablet Take 1 tablet (4 mg total) by mouth every 8 (eight) hours as needed for nausea or vomiting. 12/20/22   Willo Dunnings, MD  pantoprazole  (PROTONIX ) 20 MG tablet Take 1 tablet (20 mg total) by mouth daily. 12/20/22 12/20/23  Willo Dunnings, MD  PAXLOVID, 300/100, 20 x 150 MG & 10 x 100MG  TBPK SMARTSIG:By Mouth Morning-Evening 12/09/22   [provider]  Pediatric Multivit-Minerals-C (CHEWABLES MULTIVITAMIN PO) Take by mouth.    [provider]  Polyethyl Glycol-Propyl Glycol (SYSTANE OP) Apply to eye.    [provider]  prochlorperazine  (COMPAZINE ) 10 MG tablet Take 1 tablet (10 mg total) by mouth every 6 (six) hours as needed for nausea or vomiting. 08/01/21   Willo Dunnings, MD  promethazine-dextromethorphan (PROMETHAZINE-DM) 6.25-15 MG/5ML syrup Take by mouth. 11/13/23   [provider]  trospium  (SANCTURA ) 20 MG tablet Take 1 tablet (20 mg total) by mouth 2 (two) times daily as needed. 03/04/23   Twylla Glendia BROCKS, MD  venlafaxine XR (EFFEXOR-XR) 37.5 MG 24 hr capsule  11/13/23   [provider]  SANFORD 5 % SOLN  09/16/19   [provider]    Family History Family History  Problem Relation Age of Onset   Ulcers Mother    Hypertension Father    Prostate cancer Father    Colon polyps Father    Stroke Maternal Grandmother    Diabetes Paternal Grandfather    Hypertension Brother    Colon polyps Brother    Clotting disorder  Brother    Migraines Neg Hx    Breast cancer Neg Hx     Social History Social History   Tobacco Use   Smoking status: Never   Smokeless tobacco: Never  Vaping Use   Vaping status: Never Used  Substance Use Topics   Alcohol use: No   Drug use: No     Allergies   Macrobid [nitrofurantoin macrocrystal] and Tdap [tetanus-diphth-acell pertussis]   Review of Systems Review of Systems  Constitutional: Negative.   HENT:  Positive for congestion, ear pain, sinus pressure and sinus pain. Negative for dental problem, drooling, ear discharge, facial swelling, hearing loss, mouth sores, nosebleeds, postnasal drip, rhinorrhea, sneezing, sore throat, tinnitus, trouble swallowing and voice change.   Respiratory:  Positive for cough. Negative for apnea, choking, chest tightness, shortness of breath, wheezing and stridor.   Cardiovascular: Negative.   Gastrointestinal:  Positive for diarrhea. Negative for abdominal distention, abdominal pain, anal bleeding, blood in stool, constipation, nausea, rectal pain and  vomiting.     Physical Exam Triage Vital Signs ED Triage Vitals  Encounter Vitals Group     BP 10/25/24 1733 136/80     Girls Systolic BP Percentile --      Girls Diastolic BP Percentile --      Boys Systolic BP Percentile --      Boys Diastolic BP Percentile --      Pulse Rate 10/25/24 1733 (!) 101     Resp 10/25/24 1733 20     Temp 10/25/24 1733 98.1 F (36.7 C)     Temp Source 10/25/24 1733 Oral     SpO2 10/25/24 1733 96 %     Weight --      Height --      Head Circumference --      Peak Flow --      Pain Score 10/25/24 1729 5     Pain Loc --      Pain Education --      Exclude from Growth Chart --    No data found.  Updated Vital Signs BP 136/80 (BP Location: Left Arm)   Pulse (!) 101   Temp 98.1 F (36.7 C) (Oral)   Resp 20   SpO2 96%   Visual Acuity Right Eye Distance:   Left Eye Distance:   Bilateral Distance:    Right Eye Near:   Left Eye Near:     Bilateral Near:     Physical Exam Constitutional:      Appearance: Normal appearance.  HENT:     Right Ear: Ear canal and external ear normal. A middle ear effusion is present.     Left Ear: Ear canal and external ear normal. A middle ear effusion is present.     Nose: Congestion present.     Right Sinus: Maxillary sinus tenderness and frontal sinus tenderness present.     Left Sinus: Maxillary sinus tenderness and frontal sinus tenderness present.     Mouth/Throat:     Pharynx: No oropharyngeal exudate or posterior oropharyngeal erythema.  Eyes:     Extraocular Movements: Extraocular movements intact.  Cardiovascular:     Rate and Rhythm: Normal rate and regular rhythm.     Pulses: Normal pulses.     Heart sounds: Normal heart sounds.  Pulmonary:     Effort: Pulmonary effort is normal.     Breath sounds: Normal breath sounds.  Musculoskeletal:     Cervical back: Normal range of motion.  Lymphadenopathy:     Cervical: Cervical adenopathy present.  Neurological:     Mental Status: She is alert and oriented to person, place, and time. Mental status is at baseline.      UC Treatments / Results  Labs (all labs ordered are listed, but only abnormal results are displayed) Labs Reviewed - No data to display  EKG   Radiology No results found.  Procedures Procedures (including critical care time)  Medications Ordered in UC Medications - No data to display  Initial Impression / Assessment and Plan / UC Course  I have reviewed the triage vital signs and the nursing notes.  Pertinent labs & imaging results that were available during my care of the patient were reviewed by me and considered in my medical decision making (see chart for details).  Acute nonrecurrent pansinusitis  Patient is in no signs of distress nor toxic appearing.  Vital signs are stable.  Low suspicion for pneumonia, pneumothorax or bronchitis and therefore will defer imaging.  Viral testing  deferred  due to timeline.  Symptoms persisting for 2 weeks consistent with a sinusitis, prescribed Augmentin  prednisone Tessalon and Promethazine DM for management.May use additional over-the-counter medications as needed for supportive care.  May follow-up with urgent care as needed if symptoms persist or worsen.   Final Clinical Impressions(s) / UC Diagnoses   Final diagnoses:  None   Discharge Instructions   None    ED Prescriptions   None    PDMP not reviewed this encounter.   Teresa Shelba SAUNDERS, NP 10/25/24 [phone]

## 2024-11-14 ENCOUNTER — Emergency Department
Admission: EM | Admit: 2024-11-14 | Discharge: 2024-11-14 | Disposition: A | Discharge status: Home / Self Care | Attending: Emergency Medicine | Admitting: Emergency Medicine

## 2024-11-14 ENCOUNTER — Emergency Department

## 2024-11-14 ENCOUNTER — Other Ambulatory Visit: Payer: Self-pay

## 2024-11-14 DIAGNOSIS — S0101XA Laceration without foreign body of scalp, initial encounter: Secondary | ICD-10-CM

## 2024-11-14 DIAGNOSIS — W19XXXA Unspecified fall, initial encounter: Secondary | ICD-10-CM

## 2024-11-14 MED ORDER — ACETAMINOPHEN 325 MG PO TABS
650.0000 mg | ORAL_TABLET | Freq: Once | ORAL | Status: AC
  Administered 2024-11-14: 650 mg via ORAL
  Filled 2024-11-14: qty 2

## 2024-11-14 MED ORDER — TIZANIDINE HCL 4 MG PO TABS
2.0000 mg | ORAL_TABLET | Freq: Once | ORAL | Status: AC
  Administered 2024-11-14: 2 mg via ORAL
  Filled 2024-11-14: qty 1

## 2024-11-14 MED ORDER — TIZANIDINE HCL 4 MG PO TABS: 4.0000 mg | ORAL_TABLET | Freq: Three times a day (TID) | ORAL | 0 refills | Status: AC

## 2024-11-14 MED ORDER — LIDOCAINE-EPINEPHRINE-TETRACAINE (LET) TOPICAL GEL
3.0000 mL | Freq: Once | TOPICAL | Status: AC
  Administered 2024-11-14: 3 mL via TOPICAL
  Filled 2024-11-14: qty 3

## 2024-11-14 MED ORDER — ACETAMINOPHEN ER 650 MG PO TBCR: 650.0000 mg | EXTENDED_RELEASE_TABLET | Freq: Three times a day (TID) | ORAL | 0 refills | Status: DC | PRN

## 2024-11-14 NOTE — ED Notes (Signed)
 PA, Alex at bedside for laceration repair.

## 2024-11-14 NOTE — Discharge Instructions (Signed)
 You have been diagnosed with fall, laceration of his scalp.  Please take Zanaflex  1 tablet by mouth every 8 hours for cervical strain.  You can take acetaminophen  1 tablet by mouth every 8 hours as needed for pain.  Please make an appointment with your PCP for staple removal in 7 days.  Please drink plenty of fluids.  Please come back to ED or go to your PCP if you have new symptoms symptoms worsen.  It was a pleasure to help today.  Armine Rizzolo PA-C.

## 2024-11-14 NOTE — ED Provider Notes (Signed)
 [facility] Provider Note    Event Date/Time   First MD Initiated Contact with Patient 11/14/24 2009     (approximate)   History   Fall    HPI  Karina [name] is a 53 y.o. female    with a past medical history of UTI, prediabetes, with no significant past medical history who presents to the ED complaining of fall at home. According to the patient, she was cleaning her closet and fell back hitting her occipital area of the head with a metal part of the bed.  Patient was by herself.  No witnesses.  Patient denies loss of consciousness.  Patient endorses having blurry vision, headache and some nauseas.  Patient is not taking blood thinners.  Patient last Tdap was in 2003.  Patient endorses having allergic reaction to Tdap.  Patient is here with a relative.     Patient Active Problem List   Diagnosis Date Noted   Recurrent UTI 05/27/2020   IC (interstitial cystitis) 08/05/2018   Dysuria 08/05/2018   Incomplete bladder emptying 08/05/2018   Occipital headache 09/23/2017   Encounter for long-term (current) use of high-risk medication 12/26/2015   Family history of psoriasis 12/26/2015   Bilateral cataracts 04/26/2015   Gastroesophageal reflux disease without esophagitis 04/26/2015   History of prediabetes 04/26/2015   History of vitamin D deficiency 04/26/2015   Psoriatic arthritis (HCC) 04/26/2015   Female stress incontinence 05/30/2013   Gonadoblastoma, 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   Increased frequency of urination 05/30/2013   Other specified symptom associated with female genital organs 05/30/2013   Other symptoms and signs involving the genitourinary system 05/30/2013   Uterine spasm 05/30/2013     Physical Exam   Triage Vital Signs: ED Triage Vitals  Encounter Vitals Group     BP 11/14/24 1757 136/84     Girls Systolic BP Percentile --      Girls Diastolic BP Percentile --      Boys Systolic BP Percentile --      Boys Diastolic BP Percentile --       Pulse Rate 11/14/24 1757 89     Resp 11/14/24 1757 (!) 21     Temp 11/14/24 1757 97.8 F (36.6 C)     Temp src --      SpO2 11/14/24 1757 99 %     Weight 11/14/24 1759 158 lb 1.1 oz (71.7 kg)     Height 11/14/24 1759 5' 4 (1.626 m)     Head Circumference --      Peak Flow --      Pain Score 11/14/24 1758 9     Pain Loc --      Pain Education --      Exclude from Growth Chart --     Most recent vital signs: Vitals:   11/14/24 1757  BP: 136/84  Pulse: 89  Resp: (!) 21  Temp: 97.8 F (36.6 C)  SpO2: 99%     Physical Exam Vitals and nursing note reviewed.  During triage patient was tachypneic  General:          Awake, no distress.  Oriented x 4 Head: Occipital area: Presence of laceration about 0.5 cm length, 1 mm depth.  No active bleeding. Neck: Skin is intact, no tenderness to palpation in the midline, mild tenderness to palpation in paraspinal muscles.  Full ROM. CV:                  Good peripheral perfusion.  Regular rate and rhythm. Resp:               Normal effort. no tachypnea.Equal breath sounds bilaterally.  Abd:                 No distention.  Soft nontender Other: Lower extremities without signs of trauma.               ED Results / Procedures / Treatments   Labs (all labs ordered are listed, but only abnormal results are displayed) Labs Reviewed - No data to display     RADIOLOGY I independently reviewed and interpreted imaging and agree with radiologists findings.      PROCEDURES:  Critical Care performed:   .Laceration Repair  Date/Time: 11/14/2024 9:57 PM  Performed by: Janit Kast, PA-C Authorized by: Janit Kast, PA-C   Consent:    Consent obtained:  Verbal   Consent given by:  Patient   Risks discussed:  Pain Universal protocol:    Procedure explained and questions answered to patient or proxy's satisfaction: yes     Patient identity confirmed:  Verbally with patient Anesthesia:    Anesthesia method:  Topical  application   Topical anesthetic:  LET Laceration details:    Length (cm):  1   Depth (mm):  1 Exploration:    Limited defect created (wound extended): no     Hemostasis achieved with:  LET   Imaging obtained: x-ray     Contaminated: no   Treatment:    Area cleansed with:  Chlorhexidine   Amount of cleaning:  Standard   Irrigation solution:  Sterile saline   Irrigation volume:  200   Irrigation method:  Syringe   Visualized foreign bodies/material removed: no     Debridement:  None   Undermining:  None   Scar revision: no   Skin repair:    Repair method:  Staples   Number of staples:  2 Approximation:    Approximation:  Close Post-procedure details:    Dressing:  Open (no dressing)   Procedure completion:  Tolerated well, no immediate complications    MEDICATIONS ORDERED IN ED: Medications  lidocaine -EPINEPHrine -tetracaine  (LET) topical gel (3 mLs Topical Given 11/14/24 2144)  tiZANidine  (ZANAFLEX ) tablet 2 mg (2 mg Oral Given 11/14/24 2211)  acetaminophen  (TYLENOL ) tablet 650 mg (650 mg Oral Given 11/14/24 2210)   Clinical Course as of 11/14/24 2223  Mon Nov 14, 2024  2128 CT Head Wo Contrast  No acute intracranial abnormality. 2. Posterior soft tissue irregularity and underlying soft tissue swelling compatible with known scalp laceration.   [AE]  2128 DG Cervical Spine Complete Mild to moderate degenerative changes C5-C6 and C6-C7. No acute osseous abnormality. CT follow-up if deemed clinically appropriate   [AE]  2153 Updated patient with results of CT head without contrast, and CT cervical spine.  Patient endorses headache and cervical strain.  Will order Zanaflex , acetaminophen . [AE]    Clinical Course User Index [AE] Janit Kast, PA-C    IMPRESSION / MDM / ASSESSMENT AND PLAN / ED COURSE  I reviewed the triage vital signs and the nursing notes.  Differential diagnosis includes, but is not limited to, fall, intracranial hemorrhage, skull fracture,  laceration, skull fracture, cervical subluxation.  Patient's presentation is most consistent with acute complicated illness / injury requiring diagnostic workup.   Karina [name] is a 53 y.o., female presents today after falling on his back head.  Patient denies loss of consciousness.  See HPI.  Physical exam  patient was tachypneic during triage.  On a physical exam patient is oriented 4.  Presence of laceration to the occipital area about 0.5 cm length, 1 mm depth.  No active bleeding.  Rest of physical exam is normal. Plan Patient last Tdap was in 2003.patient is allergic to tetanus vaccine patient is going to be referred to health department. Let Laceration repair Reassess CT of the head was negative for intracranial hemorrhage or skull fracture.  DG cervical spine ruled out any cervical fracture or subluxation. Patient complains of headache cervical strain.  I will order Zanaflex , acetaminophen . Patient's diagnosis is consistent with fall, scalp laceration. I independently reviewed and interpreted imaging and agree with radiologists findings. I did review the patient's allergies and medications.The patient is in stable and satisfactory condition for discharge home  Patient will be discharged home with prescriptions for Zanaflex , acetaminophen . Patient is to follow up with PCP for staple removal in 7 days as needed or otherwise directed. Patient is given ED precautions to return to the ED for any worsening or new symptoms. Discussed plan of care with patient, answered all of patient's questions, patient agreeable to plan of care. Advised patient to take medications according to the instructions on the label. Discussed possible side effects of new medications. Patient verbalized understanding.  FINAL CLINICAL IMPRESSION(S) / ED DIAGNOSES   Final diagnoses:  Fall, initial encounter  Laceration of scalp, initial encounter     Rx / DC Orders   ED Discharge Orders          Ordered     acetaminophen  (ACETAMINOPHEN  8 HOUR) 650 MG CR tablet  Every 8 hours PRN        11/14/24 2157    tiZANidine  (ZANAFLEX ) 4 MG tablet  3 times daily        11/14/24 2157             Note:  This document was prepared using Dragon voice recognition software and may include unintentional dictation errors.   Janit Kast, PA-C 11/14/24 2223    Jacolyn Pae, MD 11/14/24 2321

## 2024-11-14 NOTE — ED Triage Notes (Signed)
 Pt comes in via pov after a fall at home. Pt was standing on a stool reaching for something in the closet, when the stool slipped out from under her. Pt fell and hit her head on the top of the medal be frame. Pt complains of pain 9/10 in her head, and 7/10 in her neck. Pt with an about 2inch laceration to the back of her head. Pt denies LOC. Bleeding controlled at this time.

## 2024-11-22 ENCOUNTER — Ambulatory Visit: Payer: Self-pay | Admitting: Physician Assistant

## 2024-11-22 ENCOUNTER — Encounter: Payer: Self-pay | Admitting: Physician Assistant

## 2024-11-22 VITALS — BP 132/69 | HR 91 | Temp 97.7°F | Resp 16 | Wt 170.0 lb

## 2024-11-22 DIAGNOSIS — Z4802 Encounter for removal of sutures: Secondary | ICD-10-CM

## 2024-11-22 NOTE — Progress Notes (Signed)
° °  Subjective: Staple removal    Patient ID: Karina [name], Karina [name]    DOB: 1971-01-21, 53 y.o.   MRN: [id number]  HPI Patient follow-up from emergency room where she received 2 staples in the occipital area of scalp secondary to a laceration 8 days ago.  Patient states since emergency room visit no adverse reaction from his procedure.  She is here today to have staples removed.   Review of Systems GERD, recurrent UTI, and prediabetes.    Objective:   Physical Exam BP 132/69  Pulse Rate 91  Temp 97.7 F (36.5 C)  Weight 170 lb (77.1 kg)  Resp 16  SpO2 97 %  Patient has 0.5 laceration to occipital area of scalp.  No signs of secondary infection.  Wounds appear well well-healed.      Assessment & Plan: Staple removal   Obtain patient consent.  Area was prepped into staples removal.  Bacitracin was applied to area.  Patient given advice on post staple removal and wound care.  Return back to clinic condition worsens.

## 2024-11-22 NOTE — Progress Notes (Signed)
 Fall with small laceration and seen in ED with two staples placed 11/14/24 and returns for removal of staples.  No complaints and no sign of infection noted in posterior mid lower head.  Accompanied by husband.

## 2025-03-03 ENCOUNTER — Ambulatory Visit: Admitting: Urology
